# Patient Record
Sex: Female | Born: 1990 | Race: White | Hispanic: No | Marital: Single | Smoking: Never smoker
Health system: Southern US, Community
[De-identification: ages and names within clinical notes are randomized; demographics above are authoritative.]

## PROBLEM LIST (undated history)

## (undated) DIAGNOSIS — F419 Anxiety disorder, unspecified: Secondary | ICD-10-CM

## (undated) DIAGNOSIS — K589 Irritable bowel syndrome without diarrhea: Secondary | ICD-10-CM

## (undated) DIAGNOSIS — F32A Depression, unspecified: Secondary | ICD-10-CM

## (undated) DIAGNOSIS — K56609 Unspecified intestinal obstruction, unspecified as to partial versus complete obstruction: Secondary | ICD-10-CM

## (undated) DIAGNOSIS — N200 Calculus of kidney: Secondary | ICD-10-CM

## (undated) HISTORY — DX: Unspecified intestinal obstruction, unspecified as to partial versus complete obstruction: K56.609

## (undated) HISTORY — DX: Irritable bowel syndrome, unspecified: K58.9

## (undated) HISTORY — DX: Depression, unspecified: F32.A

## (undated) HISTORY — DX: Calculus of kidney: N20.0

---
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
## 2019-01-24 ENCOUNTER — Other Ambulatory Visit: Payer: Self-pay

## 2019-01-24 ENCOUNTER — Encounter (HOSPITAL_COMMUNITY): Payer: Self-pay | Admitting: Emergency Medicine

## 2019-01-24 ENCOUNTER — Emergency Department (HOSPITAL_COMMUNITY)
Admission: EM | Admit: 2019-01-24 | Discharge: 2019-01-24 | Disposition: A | Discharge status: Home / Self Care | Payer: Medicaid Other | Attending: Emergency Medicine | Admitting: Emergency Medicine

## 2019-01-24 DIAGNOSIS — F419 Anxiety disorder, unspecified: Secondary | ICD-10-CM | POA: Diagnosis present

## 2019-01-24 DIAGNOSIS — R112 Nausea with vomiting, unspecified: Secondary | ICD-10-CM | POA: Insufficient documentation

## 2019-01-24 HISTORY — DX: Anxiety disorder, unspecified: F41.9

## 2019-01-24 MED ORDER — ONDANSETRON 4 MG PO TBDP
4.0000 mg | ORAL_TABLET | Freq: Once | ORAL | Status: AC
  Administered 2019-01-24: 07:00:00 4 mg via ORAL
  Filled 2019-01-24: qty 1

## 2019-01-24 MED ORDER — DICYCLOMINE HCL 20 MG PO TABS: 20.0000 mg | ORAL_TABLET | Freq: Three times a day (TID) | ORAL | 0 refills | Status: DC | PRN

## 2019-01-24 MED ORDER — ONDANSETRON 4 MG PO TBDP: 4.0000 mg | ORAL_TABLET | Freq: Four times a day (QID) | ORAL | 0 refills | Status: DC | PRN

## 2019-01-24 MED ORDER — DICYCLOMINE HCL 10 MG/ML IM SOLN
20.0000 mg | Freq: Once | INTRAMUSCULAR | Status: AC
  Administered 2019-01-24: 08:00:00 20 mg via INTRAMUSCULAR
  Filled 2019-01-24: qty 2

## 2019-01-24 NOTE — ED Provider Notes (Signed)
TIME SEEN: 6:43 AM  CHIEF COMPLAINT: Nausea, vomiting, anxiety  HPI: Patient is a 28 year old female with history of anxiety who presents to the emergency department with nausea, vomiting that started today from her anxiety.  States she has a history of the same and has required Zofran in the past to stop vomiting.  Has no antiemetics at home.  States that her vomiting starts because she is very anxious and she is unable to take anxiety medicine because of her vomiting.  She denies fevers, chills, chest pain, shortness of breath, cough, diarrhea, abdominal pain.  Does have some burning in her throat from vomiting.  States she is sexually active with female partners.  She is not concerned about pregnancy.  Last menstrual cycle was April 9.  Denies SI, HI.  ROS: See HPI Constitutional: no fever  Eyes: no drainage  ENT: no runny nose   Cardiovascular:  no chest pain  Resp: no SOB  GI:  vomiting GU: no dysuria Integumentary: no rash  Allergy: no hives  Musculoskeletal: no leg swelling  Neurological: no slurred speech ROS otherwise negative  PAST MEDICAL HISTORY/PAST SURGICAL HISTORY:  Past Medical History:  Diagnosis Date  . Anxiety 2010    MEDICATIONS:  Prior to Admission medications   Not on File    ALLERGIES:  No Known Allergies  SOCIAL HISTORY:  Social History   Tobacco Use  . Smoking status: Never Smoker  . Smokeless tobacco: Never Used  Substance Use Topics  . Alcohol use: Not Currently    Comment: occasionally    FAMILY HISTORY: History reviewed. No pertinent family history.  EXAM: BP 119/84 (BP Location: Right Arm)   Pulse 86   Temp 98 F (36.7 C) (Oral)   Resp 18   Ht 5\' 3"  (1.6 m)   Wt 72.6 kg   LMP 01/19/2019   SpO2 100%   BMI 28.34 kg/m  CONSTITUTIONAL: Alert and oriented and responds appropriately to questions. Well-appearing; well-nourished HEAD: Normocephalic EYES: Conjunctivae clear, pupils appear equal, EOMI ENT: normal nose; moist mucous  membranes NECK: Supple, no meningismus, no nuchal rigidity, no LAD  CARD: RRR; S1 and S2 appreciated; no murmurs, no clicks, no rubs, no gallops RESP: Normal chest excursion without splinting or tachypnea; breath sounds clear and equal bilaterally; no wheezes, no rhonchi, no rales, no hypoxia or respiratory distress, speaking full sentences ABD/GI: Normal bowel sounds; non-distended; soft, non-tender, no rebound, no guarding, no peritoneal signs, no hepatosplenomegaly BACK:  The back appears normal and is non-tender to palpation, there is no CVA tenderness EXT: Normal ROM in all joints; non-tender to palpation; no edema; normal capillary refill; no cyanosis, no calf tenderness or swelling    SKIN: Normal color for age and race; warm; no rash NEURO: Moves all extremities equally PSYCH: The patient's mood and manner are appropriate. Grooming and personal hygiene are appropriate.  Denies SI or HI.  MEDICAL DECISION MAKING: Patient here with nausea, and vomiting that she states is related to her anxiety.  States she has a frequent history of the same and has previously been seen at [facility] in [facility].  States she just moved here.  States that she just wants something for nausea so that she can "get home and get back to my partner".  She has no psychiatric safety concerns at this time.  Her abdominal exam is benign.  Doubt appendicitis, colitis, bowel obstruction or diverticulitis, cholecystitis, pancreatitis.  Will give oral Zofran and p.o. challenge patient.  She  does not appear dehydrated on exam.  ED PROGRESS: Patient now reports diffuse abdominal cramping.  Will give IM Bentyl.  Reports nausea has improved and is no longer vomiting.  Drinking water without difficulty.  I feel patient is safe to be discharged home.  Will give outpatient follow-up.  Will discharge with prescriptions of Zofran and Bentyl to take at home.   At this time, I do not feel there is any  life-threatening condition present. I have reviewed and discussed all results (EKG, imaging, lab, urine as appropriate) and exam findings with patient/family. I have reviewed nursing notes and appropriate previous records.  I feel the patient is safe to be discharged home without further emergent workup and can continue workup as an outpatient as needed. Discussed usual and customary return precautions. Patient/family verbalize understanding and are comfortable with this plan.  Outpatient follow-up has been provided as needed. All questions have been answered.      Ward, Layla MawKristen N, DO 01/24/19 [phone]

## 2019-01-24 NOTE — ED Notes (Signed)
Give pt a cup of water. She is drinking water right now.

## 2019-01-24 NOTE — Discharge Instructions (Signed)
Steps to find a Primary Care Provider (PCP): ° °Call [phone] or [phone] to access "[facility] Find a Doctor Service." ° °2.  You may also go on the [facility] website at [email or web address] ° °3.  [facility] also frequently accepts new patients. ° °[facility]  °[address] °[place] [address] °[phone] ° °4.  There are also multiple [facility], [facility] and [facility] practices throughout the [place] that are frequently accepting new patients. You may find a clinic that is close to your home and contact them. ° °[facility] °[email or web address] °[phone] ° °[facility] °[email or web address] ° °[facility] °[email or web address] °[phone] ° °[facility] °[email or web address] °[phone] ° °5.  Local Health Departments also can provide primary care services. ° °[facility]  °[address] °[place] [address] [phone] ° °[facility] °[address] °[place] [address] [phone] ° °[facility] °[address]  °[place] [address] °[phone] ° ° °

## 2019-01-24 NOTE — ED Triage Notes (Signed)
Pt presents to ED c/o of anxiety attack that's causing n/v. Taking her anxiety meds regularly. Says it happens often. States, "I just need what they give me IV to help with throwing up so I can take my medicine and I'll be fine." Pt says this happens often. Emesis more than x8.

## 2019-02-25 ENCOUNTER — Emergency Department (HOSPITAL_COMMUNITY)
Admission: EM | Admit: 2019-02-25 | Discharge: 2019-02-25 | Disposition: A | Discharge status: Home / Self Care | Payer: Medicaid Other | Attending: Emergency Medicine | Admitting: Emergency Medicine

## 2019-02-25 ENCOUNTER — Encounter (HOSPITAL_COMMUNITY): Payer: Self-pay | Admitting: Emergency Medicine

## 2019-02-25 ENCOUNTER — Emergency Department (HOSPITAL_COMMUNITY): Payer: Medicaid Other

## 2019-02-25 DIAGNOSIS — Z79899 Other long term (current) drug therapy: Secondary | ICD-10-CM | POA: Diagnosis not present

## 2019-02-25 DIAGNOSIS — N23 Unspecified renal colic: Secondary | ICD-10-CM | POA: Diagnosis not present

## 2019-02-25 DIAGNOSIS — F1290 Cannabis use, unspecified, uncomplicated: Secondary | ICD-10-CM | POA: Diagnosis not present

## 2019-02-25 DIAGNOSIS — R112 Nausea with vomiting, unspecified: Secondary | ICD-10-CM

## 2019-02-25 DIAGNOSIS — R1031 Right lower quadrant pain: Secondary | ICD-10-CM

## 2019-02-25 LAB — COMPREHENSIVE METABOLIC PANEL
ALT: 16 U/L (ref 0–44)
AST: 19 U/L (ref 15–41)
Albumin: 4.3 g/dL (ref 3.5–5.0)
Alkaline Phosphatase: 47 U/L (ref 38–126)
Anion gap: 10 (ref 5–15)
BUN: 9 mg/dL (ref 6–20)
CO2: 24 mmol/L (ref 22–32)
Calcium: 8.9 mg/dL (ref 8.9–10.3)
Chloride: 104 mmol/L (ref 98–111)
Creatinine, Ser: 0.55 mg/dL (ref 0.44–1.00)
GFR calc Af Amer: >60 mL/min (ref >60)
GFR calc non Af Amer: >60 mL/min (ref >60)
Glucose, Bld: 150 mg/dL — ABNORMAL HIGH (ref 70–99)
Potassium: 3.2 mmol/L — ABNORMAL LOW (ref 3.5–5.1)
Sodium: 138 mmol/L (ref 135–145)
Total Bilirubin: 0.4 mg/dL (ref 0.3–1.2)
Total Protein: 7.4 g/dL (ref 6.5–8.1)

## 2019-02-25 LAB — CBC WITH DIFFERENTIAL/PLATELET
Abs Immature Granulocytes: 0.04 10*3/uL (ref 0.00–0.07)
Basophils Absolute: 0 10*3/uL (ref 0.0–0.1)
Basophils Relative: 0 %
Eosinophils Absolute: 0 10*3/uL (ref 0.0–0.5)
Eosinophils Relative: 0 %
HCT: 39.2 % (ref 36.0–46.0)
Hemoglobin: 12.2 g/dL (ref 12.0–15.0)
Immature Granulocytes: 0 %
Lymphocytes Relative: 9 %
Lymphs Abs: 1 10*3/uL (ref 0.7–4.0)
MCH: 26.6 pg (ref 26.0–34.0)
MCHC: 31.1 g/dL (ref 30.0–36.0)
MCV: 85.6 fL (ref 80.0–100.0)
Monocytes Absolute: 0.4 10*3/uL (ref 0.1–1.0)
Monocytes Relative: 3 %
Neutro Abs: 10 10*3/uL — ABNORMAL HIGH (ref 1.7–7.7)
Neutrophils Relative %: 88 %
Platelets: 187 10*3/uL (ref 150–400)
RBC: 4.58 MIL/uL (ref 3.87–5.11)
RDW: 12.9 % (ref 11.5–15.5)
WBC: 11.5 10*3/uL — ABNORMAL HIGH (ref 4.0–10.5)
nRBC: 0 % (ref 0.0–0.2)

## 2019-02-25 LAB — URINALYSIS, ROUTINE W REFLEX MICROSCOPIC
Bilirubin Urine: NEGATIVE
Glucose, UA: NEGATIVE mg/dL
Ketones, ur: 5 mg/dL — AB
Leukocytes,Ua: NEGATIVE
Nitrite: NEGATIVE
Protein, ur: NEGATIVE mg/dL
Specific Gravity, Urine: 1.003 — ABNORMAL LOW (ref 1.005–1.030)
pH: 7 (ref 5.0–8.0)

## 2019-02-25 LAB — RAPID URINE DRUG SCREEN, HOSP PERFORMED
Amphetamines: NOT DETECTED
Barbiturates: NOT DETECTED
Benzodiazepines: NOT DETECTED
Cocaine: NOT DETECTED
Opiates: POSITIVE — AB
Tetrahydrocannabinol: POSITIVE — AB

## 2019-02-25 LAB — I-STAT BETA HCG BLOOD, ED (MC, WL, AP ONLY): I-stat hCG, quantitative: <5 m[IU]/mL (ref <5)

## 2019-02-25 LAB — LIPASE, BLOOD: Lipase: 26 U/L (ref 11–51)

## 2019-02-25 MED ORDER — TAMSULOSIN HCL 0.4 MG PO CAPS: 0.4000 mg | ORAL_CAPSULE | Freq: Two times a day (BID) | ORAL | 0 refills | Status: DC

## 2019-02-25 MED ORDER — SODIUM CHLORIDE (PF) 0.9 % IJ SOLN
INTRAMUSCULAR | Status: AC
  Filled 2019-02-25: qty 50

## 2019-02-25 MED ORDER — METOCLOPRAMIDE HCL 5 MG/ML IJ SOLN
10.0000 mg | Freq: Once | INTRAMUSCULAR | Status: AC
  Administered 2019-02-25: 10 mg via INTRAVENOUS
  Filled 2019-02-25: qty 2

## 2019-02-25 MED ORDER — ONDANSETRON 8 MG PO TBDP: ORAL_TABLET | ORAL | 0 refills | Status: DC

## 2019-02-25 MED ORDER — POTASSIUM CHLORIDE CRYS ER 20 MEQ PO TBCR
40.0000 meq | EXTENDED_RELEASE_TABLET | Freq: Once | ORAL | Status: AC
  Administered 2019-02-25: 40 meq via ORAL
  Filled 2019-02-25: qty 2

## 2019-02-25 MED ORDER — MORPHINE SULFATE (PF) 4 MG/ML IV SOLN
4.0000 mg | Freq: Once | INTRAVENOUS | Status: AC
  Administered 2019-02-25: 4 mg via INTRAVENOUS
  Filled 2019-02-25: qty 1

## 2019-02-25 MED ORDER — SODIUM CHLORIDE 0.9 % IV BOLUS
1000.0000 mL | Freq: Once | INTRAVENOUS | Status: AC
  Administered 2019-02-25: 1000 mL via INTRAVENOUS

## 2019-02-25 MED ORDER — IOHEXOL 300 MG/ML  SOLN
100.0000 mL | Freq: Once | INTRAMUSCULAR | Status: AC | PRN
  Administered 2019-02-25: 100 mL via INTRAVENOUS

## 2019-02-25 MED ORDER — HYDROCODONE-ACETAMINOPHEN 5-325 MG PO TABS: 1.0000 | ORAL_TABLET | ORAL | 0 refills | Status: DC | PRN

## 2019-02-25 MED ORDER — CAPSAICIN 0.025 % EX CREA
TOPICAL_CREAM | Freq: Two times a day (BID) | CUTANEOUS | Status: DC
  Administered 2019-02-25: 1 via TOPICAL
  Filled 2019-02-25: qty 60

## 2019-02-25 NOTE — ED Notes (Signed)
Called lab urine culture added  

## 2019-02-25 NOTE — Discharge Instructions (Addendum)
1. Medications: vicodin for pain, flomax, zofran for vomiting, usual home medications 2. Treatment: rest, drink plenty of fluids,  3. Follow Up: Please followup with your primary doctor in 2-3 days for discussion of your diagnoses and further evaluation after today's visit; if you do not have a primary care doctor use the resource guide provided to find one; Please return to the ER for worsening pain, fever, persistent vomiting or other concerns

## 2019-02-25 NOTE — ED Notes (Signed)
Bed: TM22 Expected date:  Expected time:  Means of arrival:  Comments: 28 yo M/Abd pain

## 2019-02-25 NOTE — ED Triage Notes (Addendum)
Pt comes to ed via ems, c/o abdominal pain started a hr ago. Pt was complaining of constipation before that. Pt has 18 in RAC, 50 fen tyle given, and 4 of zofran. C/o right lower abdominal pain radiating to right flank. Comes from home.  V/s on arrival 128/73, hr 86, pain 10 out 10, spo2 98. Rr16.  Screaming and yelling and making vomiting noises.

## 2019-02-25 NOTE — ED Provider Notes (Signed)
[facility]-EMERGENCY DEPT Provider Note   CSN: [id number] Arrival date & time: 02/25/19  0132    History   Chief Complaint Chief Complaint  Patient presents with  . Abdominal Pain    r     HPI Cindy Mahoney is a 28 y.o. female with a hx of anxiety presents to the Emergency Department complaining of gradual, persistent, progressively worsening suprapubic abd pain onset 10pm. Associated symptoms include nausea and vomiting.  Pt reports emesis is NBNB.  Pt denies diarrhea, fever, chills, headache, neck pain, chest pain, SOB, weakness, dizziness, syncope, dysuria, hematuria.  LMP: 02/13/2019.  She reports she is unconcerned about pregnancy.  Pt denies vaginal discharge, vaginal bleeding.  She reports she is not concerned for STD.  Pt given Fentanyl enroute without relief.  Pt reports regular marijuana usage including today.  Pt states pain is a 10/10 and cramping.  Does note states right lower quadrant abdominal pain that radiates into her back.  Patient reports she has chronic back pain and pain is more suprapubic.  She denies all urinary symptoms.         The history is provided by the patient and medical records. No language interpreter was used.    Past Medical History:  Diagnosis Date  . Anxiety 2010    There are no active problems to display for this patient.   History reviewed. No pertinent surgical history.   OB History    Gravida  1   Para      Term      Preterm      AB      Living        SAB      TAB      Ectopic      Multiple      Live Births               Home Medications    Prior to Admission medications   Medication Sig Start Date End Date Taking? Authorizing Provider  acetaminophen (TYLENOL) 500 MG tablet Take 250 mg by mouth every 6 (six) hours as needed for moderate pain.   Yes [provider]  dicyclomine (BENTYL) 20 MG tablet Take 1 tablet (20 mg total) by mouth every 8 (eight) hours as needed for  spasms (Abdominal cramping). 01/24/19  Yes Ward, Layla MawKristen N, DO  sertraline (ZOLOFT) 25 MG tablet Take 25 mg by mouth daily.   Yes [provider]  HYDROcodone-acetaminophen (NORCO/VICODIN) 5-325 MG tablet Take 1 tablet by mouth every 4 (four) hours as needed. 02/25/19   Temesgen Weightman, Dahlia ClientHannah, PA-C  ondansetron (ZOFRAN ODT) 8 MG disintegrating tablet 8mg  ODT q4 hours prn nausea 02/25/19   Mckenzi Buonomo, Dahlia ClientHannah, PA-C  tamsulosin (FLOMAX) 0.4 MG CAPS capsule Take 1 capsule (0.4 mg total) by mouth 2 (two) times daily. 02/25/19   Aldous Housel, Dahlia ClientHannah, PA-C    Family History No family history on file.  Social History Social History   Tobacco Use  . Smoking status: Never Smoker  . Smokeless tobacco: Never Used  Substance Use Topics  . Alcohol use: Not Currently    Comment: occasionally  . Drug use: Yes    Frequency: 3.0 times per week    Types: Marijuana     Allergies   Patient has no known allergies.   Review of Systems Review of Systems  Constitutional: Negative for appetite change, diaphoresis, fatigue, fever and unexpected weight change.  HENT: Negative for mouth sores.   Eyes: Negative for  visual disturbance.  Respiratory: Negative for cough, chest tightness, shortness of breath and wheezing.   Cardiovascular: Negative for chest pain.  Gastrointestinal: Positive for abdominal pain, nausea and vomiting. Negative for constipation and diarrhea.  Endocrine: Negative for polydipsia, polyphagia and polyuria.  Genitourinary: Negative for dysuria, frequency, hematuria and urgency.  Musculoskeletal: Negative for back pain and neck stiffness.  Skin: Negative for rash.  Allergic/Immunologic: Negative for immunocompromised state.  Neurological: Negative for syncope, light-headedness and headaches.  Hematological: Does not bruise/bleed easily.  Psychiatric/Behavioral: Negative for sleep disturbance. The patient is not nervous/anxious.      Physical Exam Updated Vital Signs BP  (!) 124/104 (BP Location: Left Arm)   Pulse 65   Temp (!) 97.3 F (36.3 C) (Oral)   Resp 16   LMP 01/19/2019   SpO2 100%   Physical Exam Vitals signs and nursing note reviewed.  Constitutional:      General: She is in acute distress.     Appearance: She is well-developed. She is not diaphoretic.     Comments: Awake, alert. Pt screaming and writhing in bed.  Intermittent vomiting.  HENT:     Head: Normocephalic and atraumatic.     Mouth/Throat:     Pharynx: No oropharyngeal exudate.  Eyes:     General: No scleral icterus.    Conjunctiva/sclera: Conjunctivae normal.  Neck:     Musculoskeletal: Normal range of motion and neck supple.  Cardiovascular:     Rate and Rhythm: Normal rate and regular rhythm.  Pulmonary:     Effort: Pulmonary effort is normal. No respiratory distress.     Breath sounds: Normal breath sounds. No wheezing.  Abdominal:     General: Bowel sounds are normal.     Palpations: Abdomen is soft. There is no mass.     Tenderness: There is generalized abdominal tenderness and tenderness in the suprapubic area. There is guarding. There is no right CVA tenderness, left CVA tenderness or rebound.     Hernia: No hernia is present.  Musculoskeletal: Normal range of motion.  Skin:    General: Skin is warm and dry.  Neurological:     Mental Status: She is alert.     Comments: Speech is clear and goal oriented Moves extremities without ataxia      ED Treatments / Results  Labs (all labs ordered are listed, but only abnormal results are displayed) Labs Reviewed  CBC WITH DIFFERENTIAL/PLATELET - Abnormal; Notable for the following components:      Result Value   WBC 11.5 (*)    Neutro Abs 10.0 (*)    All other components within normal limits  COMPREHENSIVE METABOLIC PANEL - Abnormal; Notable for the following components:   Potassium 3.2 (*)    Glucose, Bld 150 (*)    All other components within normal limits  URINALYSIS, ROUTINE W REFLEX MICROSCOPIC -  Abnormal; Notable for the following components:   Color, Urine STRAW (*)    Specific Gravity, Urine 1.003 (*)    Hgb urine dipstick LARGE (*)    Ketones, ur 5 (*)    Bacteria, UA MANY (*)    All other components within normal limits  RAPID URINE DRUG SCREEN, HOSP PERFORMED - Abnormal; Notable for the following components:   Opiates POSITIVE (*)    Tetrahydrocannabinol POSITIVE (*)    All other components within normal limits  URINE CULTURE  LIPASE, BLOOD  I-STAT BETA HCG BLOOD, ED ([facility], [facility], [facility] ONLY)    Radiology Ct Abdomen Pelvis W  Contrast  Result Date: 02/25/2019 CLINICAL DATA:  Nausea, vomiting and abdominal pain EXAM: CT ABDOMEN AND PELVIS WITH CONTRAST TECHNIQUE: Multidetector CT imaging of the abdomen and pelvis was performed using the standard protocol following bolus administration of intravenous contrast. CONTRAST:  OMNIPAQUE IOHEXOL 300 MG/ML  SOLN COMPARISON:  None. FINDINGS: LOWER CHEST: There is no basilar pleural or apical pericardial effusion. HEPATOBILIARY: The hepatic contours and density are normal. There is no intra- or extrahepatic biliary dilatation. The gallbladder is normal. PANCREAS: The pancreatic parenchymal contours are normal and there is no ductal dilatation. There is no peripancreatic fluid collection. SPLEEN: Normal. ADRENALS/URINARY TRACT: --Adrenal glands: Normal. --Right kidney/ureter: There is a stone within the ureterovesical junction measuring 3 mm, causing mild hydroureteronephrosis and no perinephric stranding. --Left kidney/ureter: No hydronephrosis, nephroureterolithiasis, perinephric stranding or solid renal mass. --Urinary bladder: Normal for degree of distention STOMACH/BOWEL: --Stomach/Duodenum: There is no hiatal hernia or other gastric abnormality. The duodenal course and caliber are normal. --Small bowel: No dilatation or inflammation. --Colon: No focal abnormality. --Appendix: Normal. VASCULAR/LYMPHATIC: Normal course and caliber of the major  abdominal vessels. No abdominal or pelvic lymphadenopathy. REPRODUCTIVE: Normal uterus and ovaries. MUSCULOSKELETAL. No bony spinal canal stenosis or focal osseous abnormality. OTHER: None. IMPRESSION: 1. Right obstructive uropathy with 3 mm stone at the ureterovesical junction causing mild hydroureteronephrosis. 2. No nephrolithiasis. 3. Normal appendix. Electronically Signed   By: Deatra Mort M.D.   On: 02/25/2019 06:45    Procedures Procedures (including critical care time)  Medications Ordered in ED Medications  capsaicin (ZOSTRIX) 0.025 % cream (1 application Topical Given 02/25/19 0243)  sodium chloride (PF) 0.9 % injection (has no administration in time range)  metoCLOPramide (REGLAN) injection 10 mg (10 mg Intravenous Given 02/25/19 0256)  sodium chloride 0.9 % bolus 1,000 mL (0 mLs Intravenous Stopped 02/25/19 0451)  morphine 4 MG/ML injection 4 mg (4 mg Intravenous Given 02/25/19 0257)  potassium chloride SA (K-DUR) CR tablet 40 mEq (40 mEq Oral Given 02/25/19 0506)  iohexol (OMNIPAQUE) 300 MG/ML solution 100 mL (100 mLs Intravenous Contrast Given 02/25/19 0616)     Initial Impression / Assessment and Plan / ED Course  I have reviewed the triage vital signs and the nursing notes.  Pertinent labs & imaging results that were available during my care of the patient were reviewed by me and considered in my medical decision making (see chart for details).  Clinical Course as of Feb 24 657  Sat Feb 25, 2019  4098 Patient reports she is feeling better.  Pain is now a 3/10.  She does not wish for additional pain medication.  On repeat clinical exam her pain is more focal in the right lower quadrant however she is without rebound or guarding.  Due to leukocytosis and more focal pain at this time, will obtain CT scan.  Patient and I discussed concerned about possible PID.  She is not concerned about this and does not want a pelvic exam at this time.  She does understand that my medical decision  will be somewhat limited by not performing a pelvic exam.  He does not wish for one at this time.   [HM]  [id number] Noted.  Pt is not menstruating.  Hgb urine dipstick(!): LARGE [HM]  0643 Mild hypokalemia.  Potassium given in ED  Potassium(!): 3.2 [HM]  0643 noted  WBC(!): 11.5 [HM]  0643 Tetrahydrocannabinol(!): POSITIVE [HM]  0643 Opiates(!): POSITIVE [HM]  0643 Pt afebrile.  No tachycardia.  Temp: 98.4 F (36.9 C) [  HM]    Clinical Course User Index [HM] Trixy Loyola, Boyd Kerbs       Patient presents with lower abdominal pain.  On initial exam she is screaming, intermittently vomiting.  She is unwilling to lay flat.  She has chronic back pain, but no CVA tenderness on exam.  Labs reassuring with mild hypokalemia and leukocytosis.  Drug screen positive for opiates and marijuana.    05:50 AM On repeat exam after pain control, patient's pain is more focal in the right lower quadrant.  She is without rebound or guarding.  Urinalysis shows large amount of hemoglobin and bacteria but no white blood cells nitrites or leukocytes.  Concern for potential renal colic versus appendicitis.  Will obtain CT scan.  6:58 AM CT scan with 3mm UVJ stone and mild hydronephrosis.  Pt is well-appearing pain remains well under control.  No additional vomiting here in the emergency department.  She has tolerated p.o. without difficulty.  Discussed findings of the CT scan with the patient.  She will have close follow-up with urology.  On repeat exam, patient remains without rebound or guarding.  No CVA tenderness.  Discussed reasons to return immediately to the emergency department.  Questions answered.  Patient is in agreement with the plan for discharge.  Final Clinical Impressions(s) / ED Diagnoses   Final diagnoses:  Right lower quadrant abdominal pain  Non-intractable vomiting with nausea, unspecified vomiting type  Marijuana use  Renal colic on right side    ED Discharge Orders         Ordered     tamsulosin (FLOMAX) 0.4 MG CAPS capsule  2 times daily     02/25/19 0655    ondansetron (ZOFRAN ODT) 8 MG disintegrating tablet     02/25/19 0655    HYDROcodone-acetaminophen (NORCO/VICODIN) 5-325 MG tablet  Every 4 hours PRN     02/25/19 0655           Sufian Ravi, Dahlia Client, PA-C 02/25/19 0659    Ward, Layla Maw, DO 02/25/19 8119

## 2019-02-25 NOTE — ED Notes (Signed)
Walked pt to [employer] out pts eye

## 2019-02-26 ENCOUNTER — Encounter (HOSPITAL_COMMUNITY): Payer: Self-pay

## 2019-02-26 ENCOUNTER — Other Ambulatory Visit: Payer: Self-pay

## 2019-02-26 ENCOUNTER — Emergency Department (HOSPITAL_COMMUNITY)
Admission: EM | Admit: 2019-02-26 | Discharge: 2019-02-26 | Disposition: A | Discharge status: Home / Self Care | Payer: Medicaid Other | Attending: Emergency Medicine | Admitting: Emergency Medicine

## 2019-02-26 DIAGNOSIS — Z79899 Other long term (current) drug therapy: Secondary | ICD-10-CM | POA: Diagnosis not present

## 2019-02-26 DIAGNOSIS — R1031 Right lower quadrant pain: Secondary | ICD-10-CM | POA: Diagnosis present

## 2019-02-26 DIAGNOSIS — N201 Calculus of ureter: Secondary | ICD-10-CM | POA: Diagnosis not present

## 2019-02-26 LAB — CBC WITH DIFFERENTIAL/PLATELET
Abs Immature Granulocytes: 0.01 10*3/uL (ref 0.00–0.07)
Basophils Absolute: 0 10*3/uL (ref 0.0–0.1)
Basophils Relative: 0 %
Eosinophils Absolute: 0.1 10*3/uL (ref 0.0–0.5)
Eosinophils Relative: 2 %
HCT: 38.3 % (ref 36.0–46.0)
Hemoglobin: 11.9 g/dL — ABNORMAL LOW (ref 12.0–15.0)
Immature Granulocytes: 0 %
Lymphocytes Relative: 36 %
Lymphs Abs: 2.8 10*3/uL (ref 0.7–4.0)
MCH: 27.1 pg (ref 26.0–34.0)
MCHC: 31.1 g/dL (ref 30.0–36.0)
MCV: 87.2 fL (ref 80.0–100.0)
Monocytes Absolute: 0.5 10*3/uL (ref 0.1–1.0)
Monocytes Relative: 7 %
Neutro Abs: 4.3 10*3/uL (ref 1.7–7.7)
Neutrophils Relative %: 55 %
Platelets: 179 10*3/uL (ref 150–400)
RBC: 4.39 MIL/uL (ref 3.87–5.11)
RDW: 13.2 % (ref 11.5–15.5)
WBC: 7.8 10*3/uL (ref 4.0–10.5)
nRBC: 0 % (ref 0.0–0.2)

## 2019-02-26 LAB — BASIC METABOLIC PANEL
Anion gap: 5 (ref 5–15)
BUN: 8 mg/dL (ref 6–20)
CO2: 30 mmol/L (ref 22–32)
Calcium: 8.7 mg/dL — ABNORMAL LOW (ref 8.9–10.3)
Chloride: 103 mmol/L (ref 98–111)
Creatinine, Ser: 0.57 mg/dL (ref 0.44–1.00)
GFR calc Af Amer: >60 mL/min (ref >60)
GFR calc non Af Amer: >60 mL/min (ref >60)
Glucose, Bld: 113 mg/dL — ABNORMAL HIGH (ref 70–99)
Potassium: 3.6 mmol/L (ref 3.5–5.1)
Sodium: 138 mmol/L (ref 135–145)

## 2019-02-26 LAB — URINE CULTURE: Culture: 20000 — AB

## 2019-02-26 MED ORDER — HYDROCODONE-ACETAMINOPHEN 5-325 MG PO TABS: 1.0000 | ORAL_TABLET | Freq: Once | ORAL | Status: DC

## 2019-02-26 MED ORDER — HYDROCODONE-ACETAMINOPHEN 5-325 MG PO TABS: 1.0000 | ORAL_TABLET | Freq: Four times a day (QID) | ORAL | 0 refills | Status: DC | PRN

## 2019-02-26 MED ORDER — KETOROLAC TROMETHAMINE 15 MG/ML IJ SOLN
15.0000 mg | Freq: Once | INTRAMUSCULAR | Status: AC
  Administered 2019-02-26: 15 mg via INTRAVENOUS
  Filled 2019-02-26: qty 1

## 2019-02-26 NOTE — ED Triage Notes (Addendum)
Patient seen here Saturday night.   Patient reports she had a kidney stone and was told to come back if it got worse.   Patient states she is out of pain medication and it still having pain.   Patient states she has not passed the stone yet.   C/O right lower quadrant pain 6/10 pain.   A/Ox4  Patient states she is not pregnant.

## 2019-02-26 NOTE — ED Provider Notes (Signed)
[facility]-EMERGENCY DEPT Provider Note   CSN: [id number] Arrival date & time: 02/26/19  8416    History   Chief Complaint No chief complaint on file.   HPI Cindy Mahoney is a 28 y.o. female who presents today for evaluation of kidney stone pain.  She was seen and discharged this morning at 7 AM for a right-sided kidney stone.  She reports that she was told to come back if her pain got worse.  She has already taken all of her pain medicine and says that her pain is getting worse.  Chart review and review of PMP show that she was given 6 pain pills with instructions to take it every 4 hours as needed.  She tells me that initially she took 2 of them has taken the rest throughout the day and then prior to coming took an additional 2 and is now out.  She reports that her pain is not significantly worse than it was prior to her visit today.  She reports that her pain is a 8 out of 10 as opposed to when she came and it was 11 out of 10.  Chart review shows that she has a 3 mm stone on the right side.  She denies any new concerns.  Her pain has not changed locations.  No fevers.  He is refusing UA, stating that she will go to her urologist tomorrow.  Of note patient also reports that she has dissociative identity disorder and that during her visit last night she reportedly kept frequently dissociating.     HPI  Past Medical History:  Diagnosis Date   Anxiety 2010    There are no active problems to display for this patient.   History reviewed. No pertinent surgical history.   OB History    Gravida  1   Para      Term      Preterm      AB      Living        SAB      TAB      Ectopic      Multiple      Live Births               Home Medications    Prior to Admission medications   Medication Sig Start Date End Date Taking? Authorizing Provider  acetaminophen (TYLENOL) 500 MG tablet Take 250 mg by mouth every 6 (six) hours as  needed for moderate pain.   Yes [provider]  dicyclomine (BENTYL) 20 MG tablet Take 1 tablet (20 mg total) by mouth every 8 (eight) hours as needed for spasms (Abdominal cramping). 01/24/19  Yes Ward, Layla Maw, DO  ondansetron (ZOFRAN ODT) 8 MG disintegrating tablet 8mg  ODT q4 hours prn nausea 02/25/19  Yes Muthersbaugh, Dahlia Client, PA-C  sertraline (ZOLOFT) 25 MG tablet Take 25 mg by mouth daily.   Yes [provider]  tamsulosin (FLOMAX) 0.4 MG CAPS capsule Take 1 capsule (0.4 mg total) by mouth 2 (two) times daily. 02/25/19  Yes Muthersbaugh, Dahlia Client, PA-C  HYDROcodone-acetaminophen (NORCO/VICODIN) 5-325 MG tablet Take 1 tablet by mouth every 6 (six) hours as needed for severe pain. 02/26/19   Cristina Gong, PA-C    Family History History reviewed. No pertinent family history.  Social History Social History   Tobacco Use   Smoking status: Never Smoker   Smokeless tobacco: Never Used  Substance Use Topics   Alcohol use: Not Currently  Comment: occasionally   Drug use: Yes    Frequency: 3.0 times per week    Types: Marijuana     Allergies   Patient has no known allergies.   Review of Systems Review of Systems  Constitutional: Negative for activity change and fever.  HENT: Negative for congestion.   Respiratory: Negative for shortness of breath.   Genitourinary: Negative for dysuria, flank pain and frequency.       Right lateral abdominal pain, lower  All other systems reviewed and are negative.    Physical Exam Updated Vital Signs BP 109/75    Pulse 70    Temp 98 F (36.7 C) (Oral)    Resp 18    LMP 01/19/2019    SpO2 98%   Physical Exam Vitals signs and nursing note reviewed.  Constitutional:      General: She is not in acute distress.    Appearance: She is well-developed.  HENT:     Head: Normocephalic and atraumatic.     Mouth/Throat:     Mouth: Mucous membranes are moist.  Eyes:     Conjunctiva/sclera: Conjunctivae normal.    Neck:     Musculoskeletal: Neck supple.  Cardiovascular:     Rate and Rhythm: Normal rate and regular rhythm.     Heart sounds: No murmur.  Pulmonary:     Effort: Pulmonary effort is normal. No respiratory distress.     Breath sounds: Normal breath sounds.  Abdominal:     General: Bowel sounds are normal.     Palpations: Abdomen is soft.     Tenderness: There is no abdominal tenderness.     Hernia: No hernia is present.  Skin:    General: Skin is warm and dry.  Neurological:     General: No focal deficit present.     Mental Status: She is alert and oriented to person, place, and time.  Psychiatric:        Mood and Affect: Mood normal.        Behavior: Behavior normal.      ED Treatments / Results  Labs (all labs ordered are listed, but only abnormal results are displayed) Labs Reviewed  BASIC METABOLIC PANEL - Abnormal; Notable for the following components:      Result Value   Glucose, Bld 113 (*)    Calcium 8.7 (*)    All other components within normal limits  CBC WITH DIFFERENTIAL/PLATELET - Abnormal; Notable for the following components:   Hemoglobin 11.9 (*)    All other components within normal limits    EKG None  Radiology- obtained at previous visit.  Ct Abdomen Pelvis W Contrast  Result Date: 02/25/2019 CLINICAL DATA:  Nausea, vomiting and abdominal pain EXAM: CT ABDOMEN AND PELVIS WITH CONTRAST TECHNIQUE: Multidetector CT imaging of the abdomen and pelvis was performed using the standard protocol following bolus administration of intravenous contrast. CONTRAST:  OMNIPAQUE IOHEXOL 300 MG/ML  SOLN COMPARISON:  None. FINDINGS: LOWER CHEST: There is no basilar pleural or apical pericardial effusion. HEPATOBILIARY: The hepatic contours and density are normal. There is no intra- or extrahepatic biliary dilatation. The gallbladder is normal. PANCREAS: The pancreatic parenchymal contours are normal and there is no ductal dilatation. There is no peripancreatic  fluid collection. SPLEEN: Normal. ADRENALS/URINARY TRACT: --Adrenal glands: Normal. --Right kidney/ureter: There is a stone within the ureterovesical junction measuring 3 mm, causing mild hydroureteronephrosis and no perinephric stranding. --Left kidney/ureter: No hydronephrosis, nephroureterolithiasis, perinephric stranding or solid renal mass. --Urinary bladder: Normal for  degree of distention STOMACH/BOWEL: --Stomach/Duodenum: There is no hiatal hernia or other gastric abnormality. The duodenal course and caliber are normal. --Small bowel: No dilatation or inflammation. --Colon: No focal abnormality. --Appendix: Normal. VASCULAR/LYMPHATIC: Normal course and caliber of the major abdominal vessels. No abdominal or pelvic lymphadenopathy. REPRODUCTIVE: Normal uterus and ovaries. MUSCULOSKELETAL. No bony spinal canal stenosis or focal osseous abnormality. OTHER: None. IMPRESSION: 1. Right obstructive uropathy with 3 mm stone at the ureterovesical junction causing mild hydroureteronephrosis. 2. No nephrolithiasis. 3. Normal appendix. Electronically Signed   By: Deatra RobinsonKevin  Herman M.D.   On: 02/25/2019 06:45    Procedures Procedures (including critical care time)  Medications Ordered in ED Medications  ketorolac (TORADOL) 15 MG/ML injection 15 mg (15 mg Intravenous Given 02/26/19 2033)     Initial Impression / Assessment and Plan / ED Course  I have reviewed the triage vital signs and the nursing notes.  Pertinent labs & imaging results that were available during my care of the patient were reviewed by me and considered in my medical decision making (see chart for details).  Clinical Course as of Feb 25 2058  Sun Feb 26, 2019  [address]   [EH]    Clinical Course User Index [EH] Cristina GongHammond, Javaya [place] W, PA-C      Patient presents today for concern of pain with kidney stones.  She was seen and discharged approximately 12 hours prior to arrival for a right sided ureteral 3 mm stone.  Review of  chart and PMP shows that she was given 6 pills of Vicodin with instructions to take 1 every 4 hours as needed.  She states that she has taken this amount in the past 12 hours as it was not controlling her pain.  She reports that her pain is not as bad as when she came in originally rather she is afraid that it will return.  She denies any other new concerns.  Labs were obtained without evidence of acute abnormalities.  She is given a dose of IV Toradol while in the department.  We discussed, at length, appropriate use of narcotic medicine and the need to take additional medicines along with appropriate pain goals including pain management rather than elimination.    PMP does not show that she has frequent narcotic prescriptions and CT scan from previous visit does show that she has a kidney stone which can be a acutely extremely painful condition.  We will give her a prescription for 4 hydrocodone with instructions to take them every 6 hours as needed.  This prescription is sent to a pharmacy, per her request, that will not be open until tomorrow morning therefore she should not have access to the additional narcotics until her original prescription would be used up.   Discussed augmentative therapy.   Patient refused repeat UA, stating that she will go to her urologist tomorrow.  Return precautions were discussed with patient who states their understanding.  At the time of discharge patient denied any unaddressed complaints or concerns.  Patient is agreeable for discharge home.   Final Clinical Impressions(s) / ED Diagnoses   Final diagnoses:  Ureteral stone    ED Discharge Orders         Ordered    HYDROcodone-acetaminophen (NORCO/VICODIN) 5-325 MG tablet  Every 6 hours PRN     02/26/19 2057           Norman ClayHammond, Zaidyn Claire W, PA-C 02/26/19 2104    Gerhard MunchLockwood, Robert, MD 03/02/19 [phone]

## 2019-02-26 NOTE — Discharge Instructions (Signed)
Today you received medications that may make you sleepy or impair your ability to make decisions.  For the next 24 hours please do not drive, operate heavy machinery, care for a small child with out another adult present, or perform any activities that may cause harm to you or someone else if you were to fall asleep or be impaired.   You are being prescribed a medication which may make you sleepy. Please follow up of listed precautions for at least 24 hours after taking one dose.  You were given a dose of Toradol while in the emergency room.  Please do not take any ibuprofen for 8 hours.  Your prescription pain medicine has Tylenol in it and so you should not take extra Tylenol.  It is very important that you take your medicine as prescribed and do not double doses.

## 2019-02-26 NOTE — ED Notes (Signed)
ED Provider at bedside. 

## 2019-12-01 IMAGING — CT CT ABDOMEN AND PELVIS WITH CONTRAST
2 of 4 series · 17 of 46 positions shown, 19 images · IV contrast (ISOVUE)
Comparison: None.

CLINICAL DATA: Nausea, vomiting and abdominal pain

EXAM:
CT ABDOMEN AND PELVIS WITH CONTRAST
TECHNIQUE: Multidetector CT imaging of the abdomen and pelvis was performed
using the standard protocol following bolus administration of
intravenous contrast.
CONTRAST:  100mL OMNIPAQUE IOHEXOL 300 MG/ML  SOLN

[Series 2: axial st · axial · 0.91mm/px · z∈[+1107,+1502]mm · 14 of 89 slices shown, 16 images]
[im 5/89  soft-tissue]
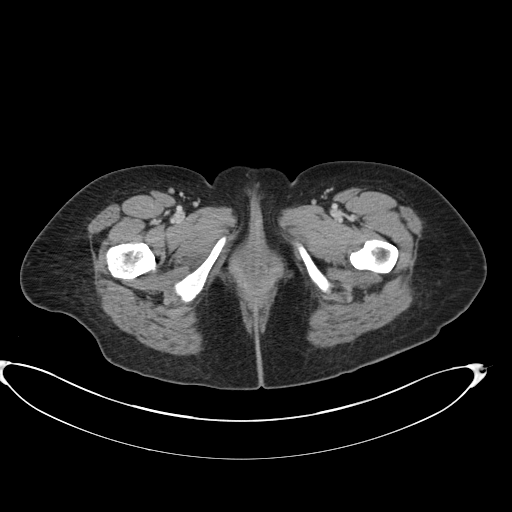
[im 5/89  bone]
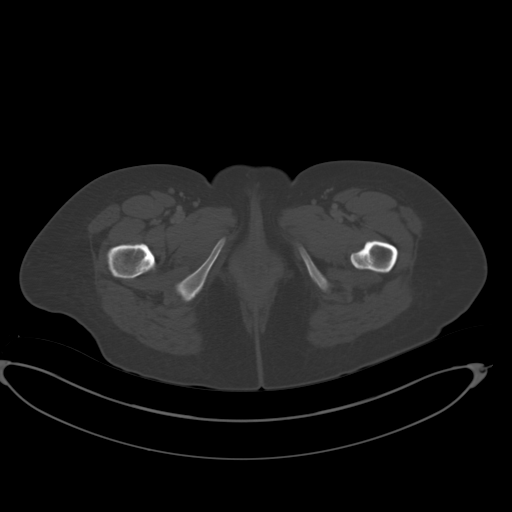
[im 10/89  soft-tissue]
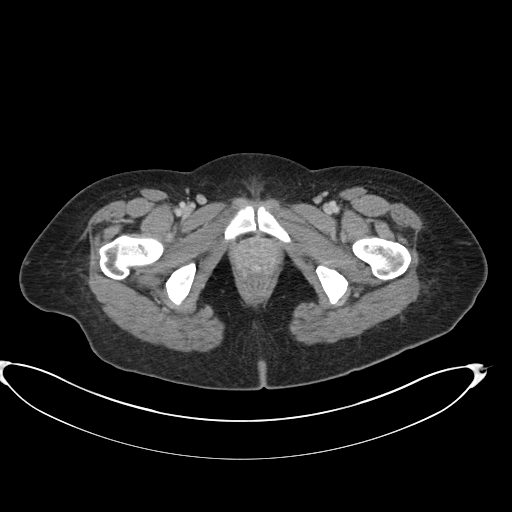
[im 20/89  soft-tissue]
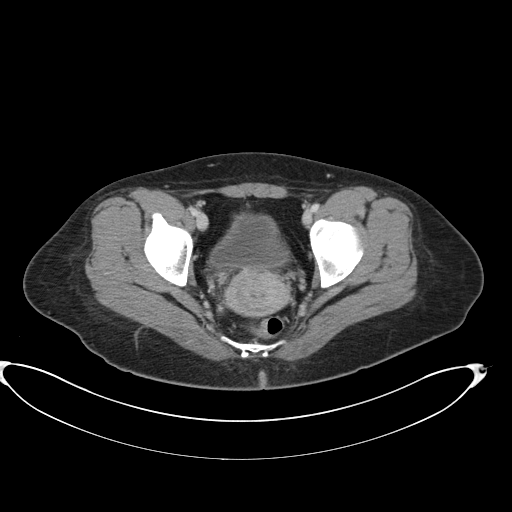
[im 25/89  soft-tissue]
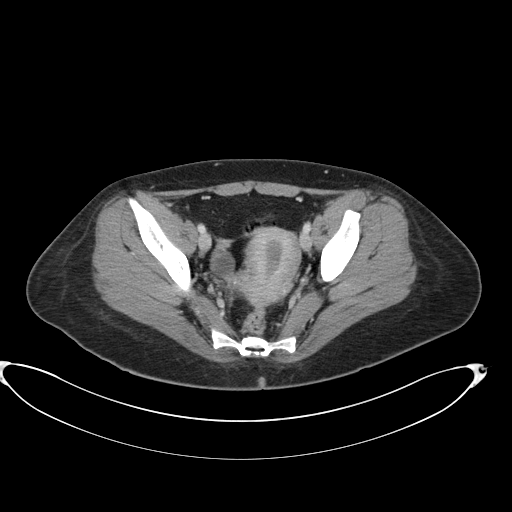
[im 30/89  soft-tissue]
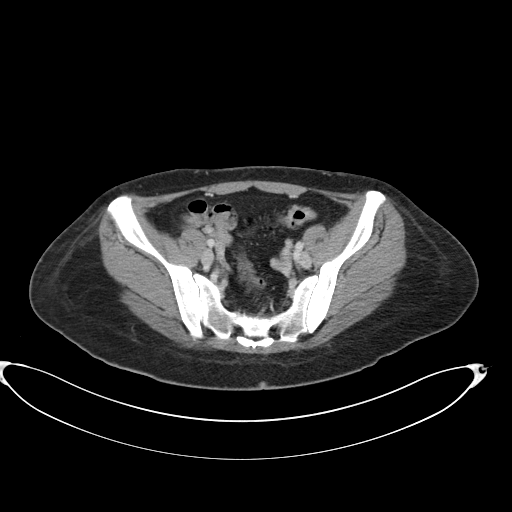
[im 35/89  soft-tissue]
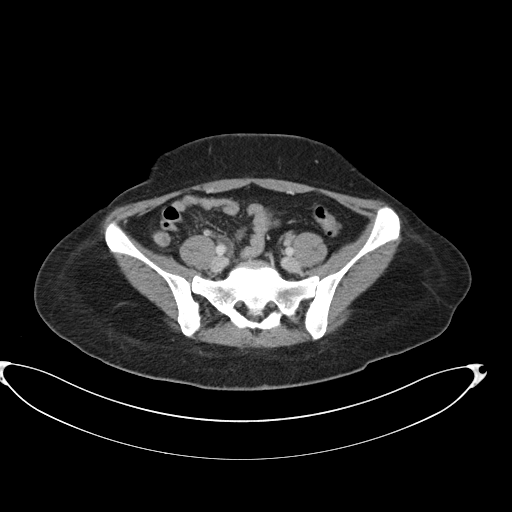
[im 40/89  soft-tissue]
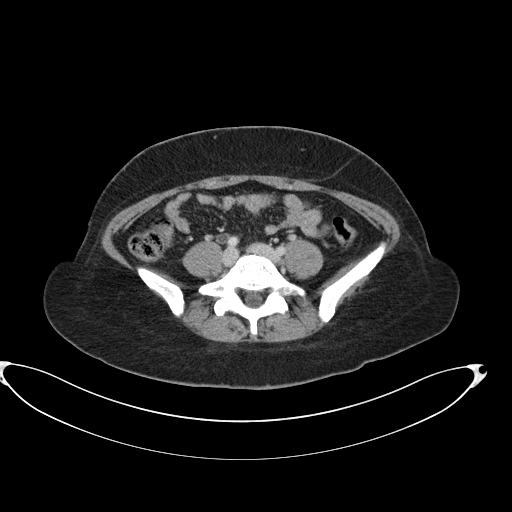
[im 49/89  soft-tissue]
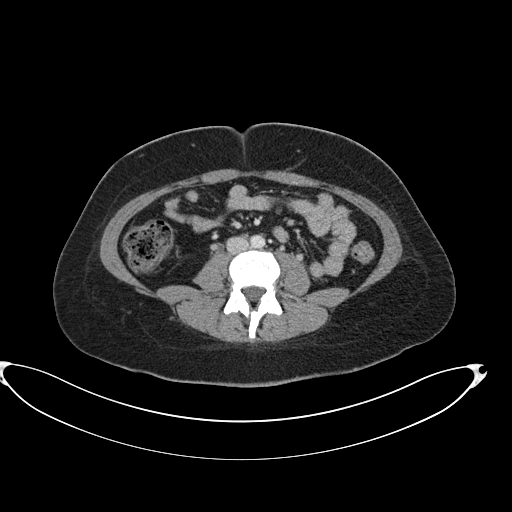
[im 54/89  soft-tissue]
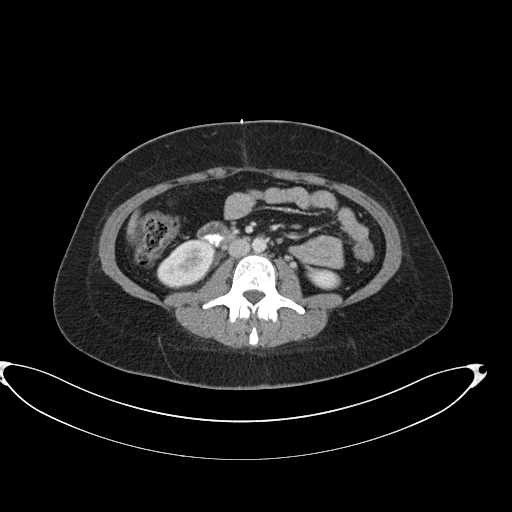
[im 54/89  bone]
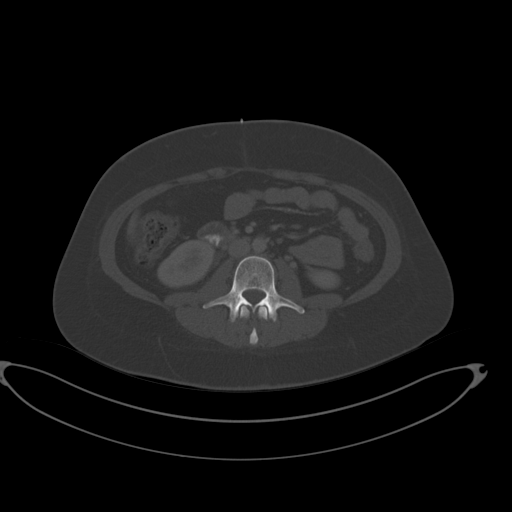
[im 59/89  soft-tissue]
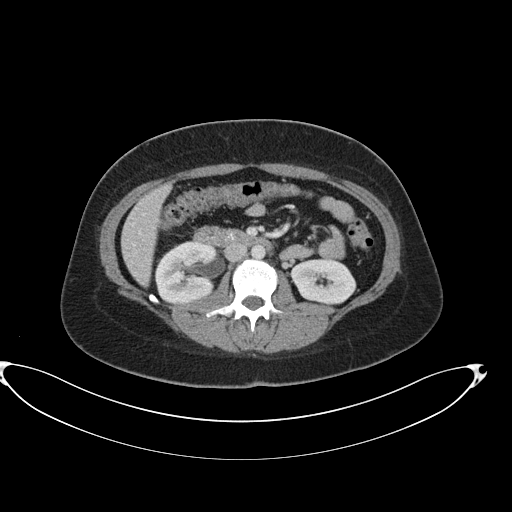
[im 64/89  soft-tissue]
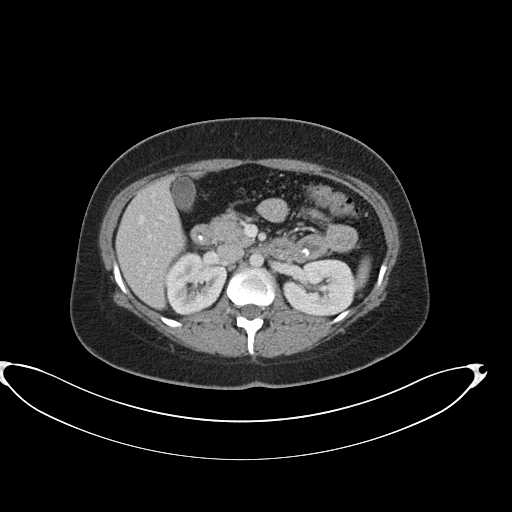
[im 69/89  soft-tissue]
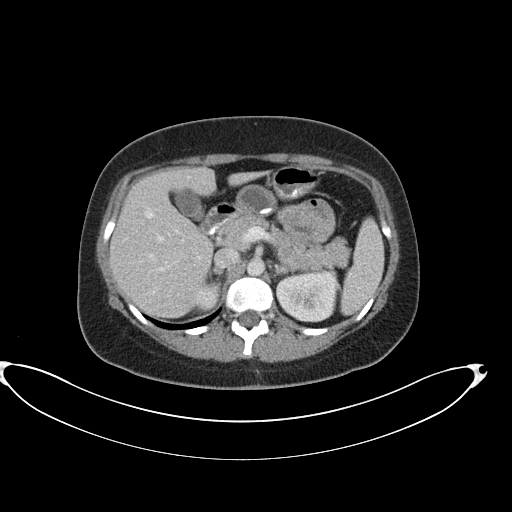
[im 79/89  soft-tissue]
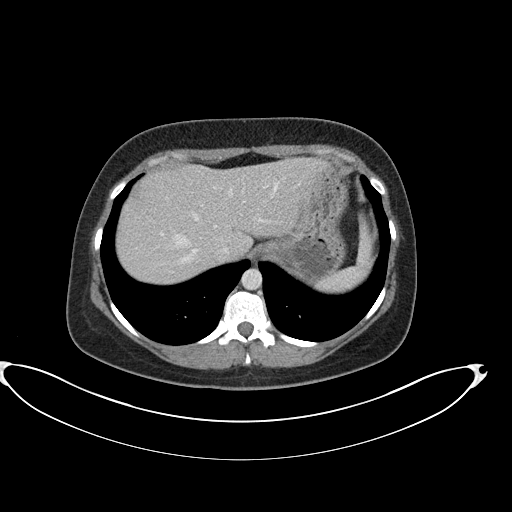
[im 84/89  soft-tissue]
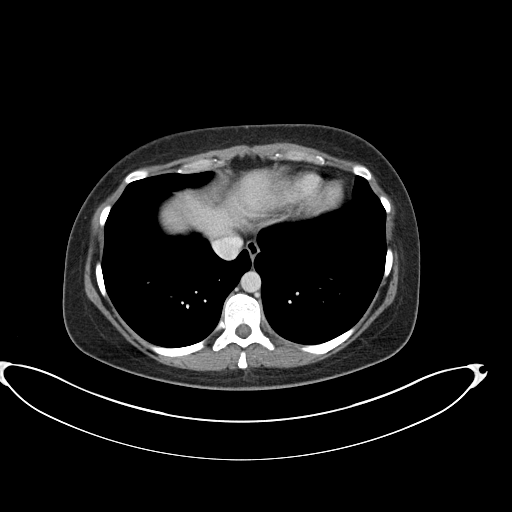

[Series 4: coronal st · coronal · 0.84mm/px · 3 of 148 slices shown]
[im 50/148  soft-tissue]
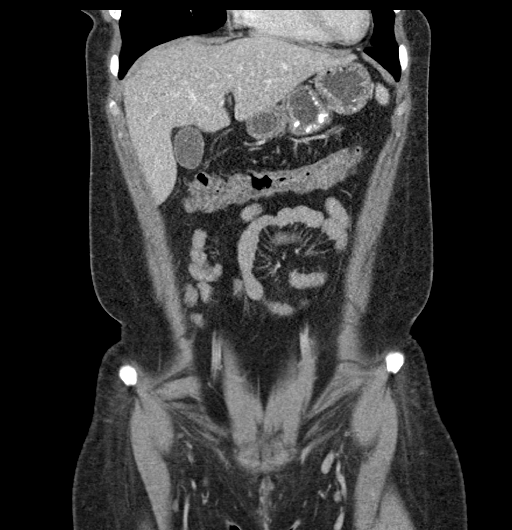
[im 66/148  soft-tissue]
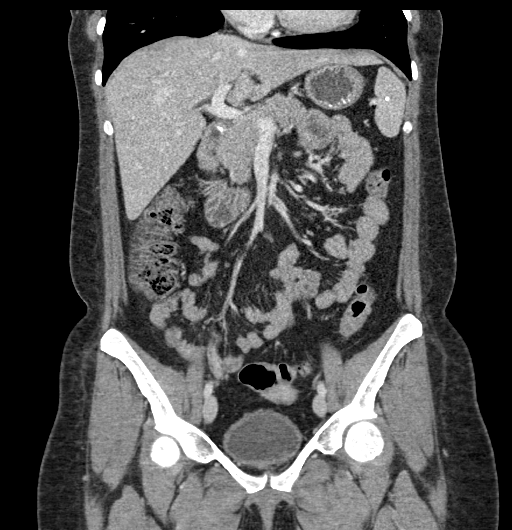
[im 82/148  soft-tissue]
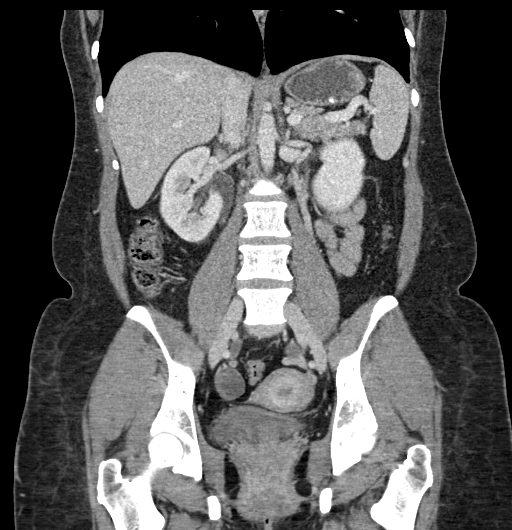

[17 of 46 positions shown; findings below may reference images not displayed]

FINDINGS: LOWER CHEST: There is no basilar pleural or apical pericardial
effusion.

HEPATOBILIARY: The hepatic contours and density are normal. There is
no intra- or extrahepatic biliary dilatation. The gallbladder is
normal.

PANCREAS: The pancreatic parenchymal contours are normal and there
is no ductal dilatation. There is no peripancreatic fluid
collection.

SPLEEN: Normal.

ADRENALS/URINARY TRACT:

--Adrenal glands: Normal.

--Right kidney/ureter: There is a stone within the ureterovesical
junction measuring 3 mm, causing mild hydroureteronephrosis and no
perinephric stranding.

--Left kidney/ureter: No hydronephrosis, nephroureterolithiasis,
perinephric stranding or solid renal mass.

--Urinary bladder: Normal for degree of distention

STOMACH/BOWEL:

--Stomach/Duodenum: There is no hiatal hernia or other gastric
abnormality. The duodenal course and caliber are normal.

--Small bowel: No dilatation or inflammation.

--Colon: No focal abnormality.

--Appendix: Normal.

VASCULAR/LYMPHATIC: Normal course and caliber of the major abdominal
vessels. No abdominal or pelvic lymphadenopathy.

REPRODUCTIVE: Normal uterus and ovaries.

MUSCULOSKELETAL. No bony spinal canal stenosis or focal osseous
abnormality.

OTHER: None.
IMPRESSION: 1. Right obstructive uropathy with 3 mm stone at the ureterovesical
junction causing mild hydroureteronephrosis.
2. No nephrolithiasis.
3. Normal appendix.

## 2020-07-10 ENCOUNTER — Other Ambulatory Visit: Payer: Self-pay

## 2020-07-10 ENCOUNTER — Encounter (HOSPITAL_COMMUNITY): Payer: Self-pay

## 2020-07-10 ENCOUNTER — Emergency Department (HOSPITAL_COMMUNITY)
Admission: EM | Admit: 2020-07-10 | Discharge: 2020-07-10 | Disposition: A | Discharge status: Home / Self Care | Payer: Medicaid Other | Attending: Emergency Medicine | Admitting: Emergency Medicine

## 2020-07-10 DIAGNOSIS — R638 Other symptoms and signs concerning food and fluid intake: Secondary | ICD-10-CM | POA: Diagnosis not present

## 2020-07-10 DIAGNOSIS — R112 Nausea with vomiting, unspecified: Secondary | ICD-10-CM | POA: Diagnosis not present

## 2020-07-10 DIAGNOSIS — R55 Syncope and collapse: Secondary | ICD-10-CM | POA: Insufficient documentation

## 2020-07-10 LAB — URINALYSIS, ROUTINE W REFLEX MICROSCOPIC
Bilirubin Urine: NEGATIVE
Glucose, UA: NEGATIVE mg/dL
Ketones, ur: 5 mg/dL — AB
Nitrite: NEGATIVE
Protein, ur: NEGATIVE mg/dL
Specific Gravity, Urine: 1.001 — ABNORMAL LOW (ref 1.005–1.030)
pH: 7 (ref 5.0–8.0)

## 2020-07-10 LAB — BASIC METABOLIC PANEL
Anion gap: 11 (ref 5–15)
BUN: 6 mg/dL (ref 6–20)
CO2: 26 mmol/L (ref 22–32)
Calcium: 9.3 mg/dL (ref 8.9–10.3)
Chloride: 103 mmol/L (ref 98–111)
Creatinine, Ser: 0.69 mg/dL (ref 0.44–1.00)
GFR calc Af Amer: >60 mL/min (ref >60)
GFR calc non Af Amer: >60 mL/min (ref >60)
Glucose, Bld: 107 mg/dL — ABNORMAL HIGH (ref 70–99)
Potassium: 3.5 mmol/L (ref 3.5–5.1)
Sodium: 140 mmol/L (ref 135–145)

## 2020-07-10 LAB — CBC
HCT: 41.4 % (ref 36.0–46.0)
Hemoglobin: 13 g/dL (ref 12.0–15.0)
MCH: 26.4 pg (ref 26.0–34.0)
MCHC: 31.4 g/dL (ref 30.0–36.0)
MCV: 84 fL (ref 80.0–100.0)
Platelets: 159 10*3/uL (ref 150–400)
RBC: 4.93 MIL/uL (ref 3.87–5.11)
RDW: 13.9 % (ref 11.5–15.5)
WBC: 7.5 10*3/uL (ref 4.0–10.5)
nRBC: 0 % (ref 0.0–0.2)

## 2020-07-10 LAB — I-STAT BETA HCG BLOOD, ED (MC, WL, AP ONLY): I-stat hCG, quantitative: <5 m[IU]/mL (ref <5)

## 2020-07-10 MED ORDER — PROMETHAZINE HCL 12.5 MG PO TABS: 12.5000 mg | ORAL_TABLET | Freq: Four times a day (QID) | ORAL | 0 refills | Status: DC | PRN

## 2020-07-10 MED ORDER — SODIUM CHLORIDE 0.9 % IV BOLUS
1000.0000 mL | Freq: Once | INTRAVENOUS | Status: AC
  Administered 2020-07-10: 1000 mL via INTRAVENOUS

## 2020-07-10 MED ORDER — ONDANSETRON 4 MG PO TBDP
4.0000 mg | ORAL_TABLET | Freq: Once | ORAL | Status: AC | PRN
  Administered 2020-07-10: 4 mg via ORAL
  Filled 2020-07-10: qty 1

## 2020-07-10 MED ORDER — PROMETHAZINE HCL 12.5 MG PO TABS: 12.5000 mg | ORAL_TABLET | Freq: Four times a day (QID) | ORAL | 0 refills | Status: AC | PRN

## 2020-07-10 MED ORDER — PROMETHAZINE HCL 25 MG/ML IJ SOLN
12.5000 mg | Freq: Once | INTRAMUSCULAR | Status: AC
  Administered 2020-07-10: 12.5 mg via INTRAVENOUS
  Filled 2020-07-10: qty 1

## 2020-07-10 NOTE — ED Notes (Signed)
Pt sleeping; rise and fall of chest noted. Pt was able to tolerate sips of OJ.

## 2020-07-10 NOTE — Discharge Instructions (Signed)
You were seen in the emergency department today for decreased appetite/intake, vomiting, and increased stress with episodes of passing out. Your work-up in the emergency department was overall reassuring. Your labs did not show any problems with your kidneys, liver, or electrolytes. You were given fluids in the emergency department.  We are sending home with Phenergan to take every 6 hours as needed for nausea and vomiting. Please start taking your outpatient medications again to assist with your sleep and anxiety.  We have prescribed you new medication(s) today. Discuss the medications prescribed today with your pharmacist as they can have adverse effects and interactions with your other medicines including over the counter and prescribed medications. Seek medical evaluation if you start to experience new or abnormal symptoms after taking one of these medicines, seek care immediately if you start to experience difficulty breathing, feeling of your throat closing, facial swelling, or rash as these could be indications of a more serious allergic reaction  Please be try to drink plenty of fluids, you may try something like Ensure if you are having trouble with nutrient intake, ideally please try to eat three meals per day.  Follow-up with your primary care provider as well as behavioral health within the next 3 days. Return to the ER for new or worsening symptoms including but not limited to recurrence of passing out, chest pain, fever, blood in vomit or stool, inability to keep fluids down, or any other concerns.

## 2020-07-10 NOTE — ED Provider Notes (Signed)
[facility] EMERGENCY DEPARTMENT Provider Note   CSN: [id number] Arrival date & time: 07/10/20  5465     History Chief Complaint  Patient presents with  . Loss of Consciousness    Cindy Mahoney is a 29 y.o. female with a history of anxiety who presents to the emergency department requesting IV fluids.  She states that she has been moving into a hotel over the past 1 week and has had significantly increased stress, poor sleep, poor appetite with fairly minimal p.o. intake as well as some nausea and vomiting.  She states her stomach often feels upset.  She relays a lot of these problems are chronic for her, they have been aggravated by her stress with moving, no alleviating factors.  She has not been taking her hydroxyzine that she is prescribed as it makes her sleepy and she needs to be doing things during the day with the move.  She states that she had 2 episodes over the past week where she became quite lightheaded and passed out.  This is happened of her before in the past.  She is in the emergency department as she would like to receive some fluids for hydration.  She denies suicidal ideation, homicidal ideation, depression, chest pain, dyspnea, focal numbness/weakness, melena, hematochezia, or dysuria.   HPI     Past Medical History:  Diagnosis Date  . Anxiety 2010    There are no problems to display for this patient.   History reviewed. No pertinent surgical history.   OB History    Gravida  1   Para      Term      Preterm      AB      Living        SAB      TAB      Ectopic      Multiple      Live Births              No family history on file.  Social History   Tobacco Use  . Smoking status: Never Smoker  . Smokeless tobacco: Never Used  Vaping Use  . Vaping Use: Never used  Substance Use Topics  . Alcohol use: Not Currently    Comment: occasionally  . Drug use: Yes    Frequency: 3.0 times per week    Types:  Marijuana    Home Medications Prior to Admission medications   Medication Sig Start Date End Date Taking? Authorizing Provider  acetaminophen (TYLENOL) 500 MG tablet Take 250 mg by mouth every 6 (six) hours as needed for moderate pain.    [provider]  dicyclomine (BENTYL) 20 MG tablet Take 1 tablet (20 mg total) by mouth every 8 (eight) hours as needed for spasms (Abdominal cramping). 01/24/19   Ward, Layla Maw, DO  HYDROcodone-acetaminophen (NORCO/VICODIN) 5-325 MG tablet Take 1 tablet by mouth every 6 (six) hours as needed for severe pain. 02/26/19   Cristina Gong, PA-C  ondansetron (ZOFRAN ODT) 8 MG disintegrating tablet 8mg  ODT q4 hours prn nausea 02/25/19   Muthersbaugh, 02/27/19, PA-C  sertraline (ZOLOFT) 25 MG tablet Take 25 mg by mouth daily.    [provider]  tamsulosin (FLOMAX) 0.4 MG CAPS capsule Take 1 capsule (0.4 mg total) by mouth 2 (two) times daily. 02/25/19   Muthersbaugh, 02/27/19, PA-C    Allergies    Patient has no known allergies.  Review of Systems   Review of Systems  Constitutional: Negative for chills and fever.  Respiratory: Negative for shortness of breath.   Cardiovascular: Negative for chest pain.  Gastrointestinal: Positive for abdominal pain, nausea and vomiting. Negative for anal bleeding, blood in stool and constipation.  Genitourinary: Negative for dysuria.  Neurological: Positive for syncope and light-headedness. Negative for weakness (focal) and numbness.  Psychiatric/Behavioral: Negative for dysphoric mood, hallucinations and suicidal ideas. The patient is nervous/anxious.   All other systems reviewed and are negative.   Physical Exam Updated Vital Signs BP 115/84 (BP Location: Left Arm)   Pulse 80   Temp 98 F (36.7 C) (Oral)   Resp 12   Ht 5\' 2"  (1.575 m)   Wt 81.6 kg   SpO2 100%   BMI 32.92 kg/m   Physical Exam Vitals and nursing note reviewed.  Constitutional:      General: She is not in acute distress.     Appearance: She is well-developed. She is not toxic-appearing.  HENT:     Head: Normocephalic and atraumatic.  Eyes:     General:        Right eye: No discharge.        Left eye: No discharge.     Conjunctiva/sclera: Conjunctivae normal.  Cardiovascular:     Rate and Rhythm: Normal rate and regular rhythm.     Heart sounds: No murmur heard.   Pulmonary:     Effort: Pulmonary effort is normal. No respiratory distress.     Breath sounds: Normal breath sounds. No wheezing, rhonchi or rales.  Abdominal:     General: There is no distension.     Palpations: Abdomen is soft.     Tenderness: There is no abdominal tenderness. There is no guarding or rebound.     Comments: Negative Murphy's and McBurney's.  Musculoskeletal:     Cervical back: Neck supple.  Skin:    General: Skin is warm and dry.     Findings: No rash.  Neurological:     Mental Status: She is alert.     Comments: Clear speech.  CN III through II grossly intact.  Strength and sensation grossly intact.  Patient is ambulatory.  Psychiatric:        Behavior: Behavior normal.     ED Results / Procedures / Treatments   Labs (all labs ordered are listed, but only abnormal results are displayed) Labs Reviewed  BASIC METABOLIC PANEL - Abnormal; Notable for the following components:      Result Value   Glucose, Bld 107 (*)    All other components within normal limits  URINALYSIS, ROUTINE W REFLEX MICROSCOPIC - Abnormal; Notable for the following components:   Color, Urine STRAW (*)    Specific Gravity, Urine 1.001 (*)    Hgb urine dipstick SMALL (*)    Ketones, ur 5 (*)    Leukocytes,Ua TRACE (*)    Bacteria, UA RARE (*)    All other components within normal limits  CBC  CBG MONITORING, ED  I-STAT BETA HCG BLOOD, ED ([facility], [facility], [facility] ONLY)    EKG None  Radiology No results found.  Procedures Procedures (including critical care time)  Medications Ordered in ED Medications  sodium chloride 0.9 % bolus 1,000 mL  (has no administration in time range)  promethazine (PHENERGAN) injection 12.5 mg (has no administration in time range)  ondansetron (ZOFRAN-ODT) disintegrating tablet 4 mg (4 mg Oral Given 07/10/20 1041)    ED Course  I have reviewed the triage vital signs and the nursing notes.  Pertinent labs & imaging results that were available during my care of the patient were reviewed by me and considered in my medical decision making (see chart for details).    MDM Rules/Calculators/A&P                         Patient presents to the ED requesting fluids as she has had poor p.o. intake and 2 syncopal episodes over the past 1 week.  On arrival patient is nontoxic and resting comfortably, her vitals are within normal limits.  She has a relatively benign physical exam.  She is no focal neurologic deficits.  Heart regular rate and rhythm.  Additional history obtained:  Additional history obtained from chart review and nursing note reviewed  EKG: no STEMI or significant concerning abnormality.   Lab Tests:  I reviewed and interpreted labs by triage, which included:  CBC, CMP, pregnancy test, and urinalysis: Fairly unremarkable, patient not pregnant, no significant electrolyte derangement, renal function preserved.  Patient's abdominal exam is unremarkable, no tenderness, no peritoneal signs, do not suspect acute surgical process. No concerning chest pain, palpitations, or dyspnea associated with her syncopal episodes, history of similar, EKG fairly reassuring, overall low risk in this regard, likely due to decreased p.o. intake. Her labs do not show evidence of significant malnourishment.  she is tolerating p.o. in the emergency department. No SI, HI, or acute psychosis to require emergent psychiatric admission. She has received fluids with some improvement. Encouraged her to restart her medications that she is prescribed for sleep &anxiety. We will discharge home with symptomatic care and outpatient  resources.I discussed results, treatment plan, need for follow-up, and return precautions with the patient. Provided opportunity for questions, patient confirmed understanding and is in agreement with plan.   Portions of this note were generated with Scientist, clinical (histocompatibility and immunogenetics). Dictation errors may occur despite best attempts at proofreading.  Final Clinical Impression(s) / ED Diagnoses Final diagnoses:  Syncope, unspecified syncope type  Decreased oral intake    Rx / DC Orders ED Discharge Orders         Ordered    promethazine (PHENERGAN) 12.5 MG tablet  Every 6 hours PRN        07/10/20 1455           Kotaro Buer, [place], PA-C 07/10/20 1505    Tilden Fossa, MD 07/11/20 1317

## 2020-07-10 NOTE — ED Triage Notes (Signed)
Patient complains of anxiety, decreased sleep and minimal intake x 4-6 days. Patient states with her little intake unable to take anxiety meds. Reports syncopal episode x 2 in the past day. Alert and oriented. Patient denies suicidal thoughts.

## 2020-07-10 NOTE — ED Notes (Signed)
Reviewed discharge instructions with patient. Follow-up care and medications reviewed. Patient verbalized understanding. Patient A&Ox4, VSS, upon discharge. 

## 2020-07-10 NOTE — ED Notes (Signed)
Provided pt w/ OJ for fluid challenge

## 2020-12-31 ENCOUNTER — Encounter (HOSPITAL_COMMUNITY): Payer: Self-pay

## 2020-12-31 ENCOUNTER — Emergency Department (HOSPITAL_COMMUNITY)
Admission: EM | Admit: 2020-12-31 | Discharge: 2020-12-31 | Disposition: A | Discharge status: Home / Self Care | Payer: Medicaid Other | Attending: Emergency Medicine | Admitting: Emergency Medicine

## 2020-12-31 ENCOUNTER — Other Ambulatory Visit: Payer: Self-pay

## 2020-12-31 DIAGNOSIS — R112 Nausea with vomiting, unspecified: Secondary | ICD-10-CM | POA: Insufficient documentation

## 2020-12-31 DIAGNOSIS — F419 Anxiety disorder, unspecified: Secondary | ICD-10-CM | POA: Diagnosis not present

## 2020-12-31 DIAGNOSIS — R1012 Left upper quadrant pain: Secondary | ICD-10-CM | POA: Insufficient documentation

## 2020-12-31 DIAGNOSIS — R110 Nausea: Secondary | ICD-10-CM

## 2020-12-31 MED ORDER — HYOSCYAMINE SULFATE SL 0.125 MG SL SUBL: 1.0000 | SUBLINGUAL_TABLET | Freq: Four times a day (QID) | SUBLINGUAL | 0 refills | Status: AC | PRN

## 2020-12-31 MED ORDER — HYOSCYAMINE SULFATE 0.125 MG SL SUBL
0.2500 mg | SUBLINGUAL_TABLET | Freq: Once | SUBLINGUAL | Status: AC
  Administered 2020-12-31: 0.25 mg via SUBLINGUAL
  Filled 2020-12-31: qty 2

## 2020-12-31 MED ORDER — ONDANSETRON 4 MG PO TBDP
4.0000 mg | ORAL_TABLET | Freq: Once | ORAL | Status: AC
  Administered 2020-12-31: 4 mg via ORAL
  Filled 2020-12-31: qty 1

## 2020-12-31 MED ORDER — ALUM & MAG HYDROXIDE-SIMETH 200-200-20 MG/5ML PO SUSP
30.0000 mL | Freq: Once | ORAL | Status: AC
  Administered 2020-12-31: 30 mL via ORAL
  Filled 2020-12-31: qty 30

## 2020-12-31 NOTE — ED Provider Notes (Signed)
[facility]-EMERGENCY DEPT Provider Note  CSN: [id number] Arrival date & time: 12/31/20 0150  Chief Complaint(s) Vomiting  HPI Cindy Mahoney is a 30 y.o. adult here for increased anxiety with nausea nonbloody nonbilious emesis.  Patient still able to tolerate oral fluids.  Endorsing mild left upper quadrant abdominal discomfort related to the emesis.  Currently only having mild discomfort.  Denies any recent fevers or infections.  No coughing or congestion.  No diarrhea.  No constipation.  Still having bowel movements and passing gas.  Denies any known sick contacts.  Endorses marijuana use.  HPI  Past Medical History Past Medical History:  Diagnosis Date  . Anxiety 2010   There are no problems to display for this patient.  Home Medication(s) Prior to Admission medications   Medication Sig Start Date End Date Taking? Authorizing Provider  acetaminophen (TYLENOL) 500 MG tablet Take 250 mg by mouth every 6 (six) hours as needed for moderate pain.   Yes [provider]  hydrOXYzine (ATARAX/VISTARIL) 50 MG tablet Take 50-100 mg by mouth 3 (three) times daily as needed for anxiety.   Yes [provider]  Hyoscyamine Sulfate SL (LEVSIN/SL) 0.125 MG SUBL Place 1 each under the tongue 4 (four) times daily as needed for up to 5 days. 12/31/20 01/05/21 Yes Cire Deyarmin, Amadeo Garnet, MD  promethazine (PHENERGAN) 12.5 MG tablet Take 1 tablet (12.5 mg total) by mouth every 6 (six) hours as needed for nausea or vomiting. 07/10/20  Yes Petrucelli, Samantha R, PA-C  traZODone (DESYREL) 150 MG tablet Take 150 mg by mouth at bedtime.   Yes [provider]                                                                                                                                    Past Surgical History History reviewed. No pertinent surgical history. Family History No family history on file.  Social History Social History   Tobacco Use  . Smoking  status: Never Smoker  . Smokeless tobacco: Never Used  Vaping Use  . Vaping Use: Never used  Substance Use Topics  . Alcohol use: Not Currently    Comment: occasionally  . Drug use: Yes    Frequency: 3.0 times per week    Types: Marijuana   Allergies Patient has no known allergies.  Review of Systems Review of Systems All other systems are reviewed and are negative for acute change except as noted in the HPI  Physical Exam Vital Signs  I have reviewed the triage vital signs BP 108/69   Pulse (!) 55   Temp 98.3 F (36.8 C) (Oral)   Resp 16   Ht 5\' 2"  (1.575 m)   Wt 63 kg   SpO2 98%   BMI 25.40 kg/m   Physical Exam Vitals reviewed.  Constitutional:      General: "Cindy Mahoney" is not in acute distress.  Appearance: Cindy Mahoney "Cindy Mahoney" is well-developed. Cindy Mahoney "Cindy Mahoney" is not diaphoretic.  HENT:     Head: Normocephalic and atraumatic.     Jaw: No trismus.     Right Ear: External ear normal.     Left Ear: External ear normal.     Nose: Nose normal.  Eyes:     General: No scleral icterus.    Conjunctiva/sclera: Conjunctivae normal.  Neck:     Trachea: Phonation normal.  Cardiovascular:     Rate and Rhythm: Normal rate and regular rhythm.  Pulmonary:     Effort: Pulmonary effort is normal. No respiratory distress.     Breath sounds: No stridor.  Abdominal:     General: There is no distension.     Tenderness: There is no abdominal tenderness.  Musculoskeletal:        General: Normal range of motion.     Cervical back: Normal range of motion.  Neurological:     Mental Status: Cindy Mahoney "Cindy Mahoney" is alert and oriented to person, place, and time.  Psychiatric:        Behavior: Behavior normal.     ED Results and Treatments Labs (all labs ordered are listed, but only abnormal results are displayed) Labs Reviewed - No data to display                                                                                                                        EKG  EKG Interpretation  Date/Time:    Ventricular Rate:    PR Interval:    QRS Duration:   QT Interval:    QTC Calculation:   R Axis:     Text Interpretation:        Radiology No results found.  Pertinent labs & imaging results that were available during my care of the patient were reviewed by me and considered in my medical decision making (see chart for details).  Medications Ordered in ED Medications  ondansetron (ZOFRAN-ODT) disintegrating tablet 4 mg (4 mg Oral Given 12/31/20 0412)  hyoscyamine (LEVSIN SL) SL tablet 0.25 mg (0.25 mg Sublingual Given 12/31/20 0411)  alum & mag hydroxide-simeth (MAALOX/MYLANTA) 200-200-20 MG/5ML suspension 30 mL (30 mLs Oral Given 12/31/20 0411)  Procedures Procedures  (including critical care time)  Medical Decision Making / ED Course I have reviewed the nursing notes for this encounter and the patient's prior records (if available in EHR or on provided paperwork).   Sidonie Dexheimer was evaluated in Emergency Department on 12/31/2020 for the symptoms described in the history of present illness. Cindy Mahoney "Cindy Mahoney" was evaluated in the context of the global COVID-19 pandemic, which necessitated consideration that the patient might be at risk for infection with the SARS-CoV-2 virus that causes COVID-19. Institutional protocols and algorithms that pertain to the evaluation of patients at risk for COVID-19 are in a state of rapid change based on information released by regulatory bodies including the CDC and federal and state organizations. These policies and algorithms were followed during the patient's care in the ED.  Anxiety induced nausea and emesis. Abdomen benign. Treated symptomatically with GI cocktail and Levsin providing significant release.  Able to tolerate  oral intake.  Doubt serious intra-abdominal inflammatory/infectious process. No need for labs or imaging at this time.      Final Clinical Impression(s) / ED Diagnoses Final diagnoses:  Anxiety  Nausea    The patient appears reasonably screened and/or stabilized for discharge and I doubt any other medical condition or other [facility] requiring further screening, evaluation, or treatment in the ED at this time prior to discharge. Safe for discharge with strict return precautions.  Disposition: Discharge  Condition: Good  I have discussed the results, Dx and Tx plan with the patient/family who expressed understanding and agree(s) with the plan. Discharge instructions discussed at length. The patient/family was given strict return precautions who verbalized understanding of the instructions. No further questions at time of discharge.    ED Discharge Orders         Ordered    Hyoscyamine Sulfate SL (LEVSIN/SL) 0.125 MG SUBL  4 times daily PRN        12/31/20 0610          Follow Up: Primary care provider  Call  as needed     This chart was dictated using voice recognition software.  Despite best efforts to proofread,  errors can occur which can change the documentation meaning.   Nira Conn, MD 12/31/20 [phone]

## 2020-12-31 NOTE — ED Triage Notes (Signed)
Pt c/o vomiting since Saturday.

## 2021-01-09 ENCOUNTER — Other Ambulatory Visit: Payer: Self-pay

## 2021-01-09 ENCOUNTER — Encounter (HOSPITAL_COMMUNITY): Payer: Self-pay

## 2021-01-09 ENCOUNTER — Emergency Department (HOSPITAL_COMMUNITY)
Admission: EM | Admit: 2021-01-09 | Discharge: 2021-01-09 | Disposition: A | Discharge status: Home / Self Care | Payer: Medicaid Other | Attending: Emergency Medicine | Admitting: Emergency Medicine

## 2021-01-09 DIAGNOSIS — R448 Other symptoms and signs involving general sensations and perceptions: Secondary | ICD-10-CM | POA: Diagnosis not present

## 2021-01-09 DIAGNOSIS — R112 Nausea with vomiting, unspecified: Secondary | ICD-10-CM

## 2021-01-09 DIAGNOSIS — R1012 Left upper quadrant pain: Secondary | ICD-10-CM | POA: Insufficient documentation

## 2021-01-09 LAB — COMPREHENSIVE METABOLIC PANEL
ALT: 9 U/L (ref 0–44)
AST: 17 U/L (ref 15–41)
Albumin: 4.3 g/dL (ref 3.5–5.0)
Alkaline Phosphatase: 39 U/L (ref 38–126)
Anion gap: 7 (ref 5–15)
BUN: 7 mg/dL (ref 6–20)
CO2: 23 mmol/L (ref 22–32)
Calcium: 9 mg/dL (ref 8.9–10.3)
Chloride: 109 mmol/L (ref 98–111)
Creatinine, Ser: 0.53 mg/dL (ref 0.44–1.00)
GFR, Estimated: >60 mL/min (ref >60)
Glucose, Bld: 105 mg/dL — ABNORMAL HIGH (ref 70–99)
Potassium: 3.7 mmol/L (ref 3.5–5.1)
Sodium: 139 mmol/L (ref 135–145)
Total Bilirubin: 0.6 mg/dL (ref 0.3–1.2)
Total Protein: 7.7 g/dL (ref 6.5–8.1)

## 2021-01-09 LAB — RAPID URINE DRUG SCREEN, HOSP PERFORMED
Amphetamines: NOT DETECTED
Barbiturates: NOT DETECTED
Benzodiazepines: NOT DETECTED
Cocaine: NOT DETECTED
Opiates: NOT DETECTED
Tetrahydrocannabinol: POSITIVE — AB

## 2021-01-09 LAB — CBC WITH DIFFERENTIAL/PLATELET
Abs Immature Granulocytes: 0.02 10*3/uL (ref 0.00–0.07)
Basophils Absolute: 0 10*3/uL (ref 0.0–0.1)
Basophils Relative: 0 %
Eosinophils Absolute: 0 10*3/uL (ref 0.0–0.5)
Eosinophils Relative: 0 %
HCT: 40.2 % (ref 36.0–46.0)
Hemoglobin: 13.1 g/dL (ref 12.0–15.0)
Immature Granulocytes: 0 %
Lymphocytes Relative: 16 %
Lymphs Abs: 1.2 10*3/uL (ref 0.7–4.0)
MCH: 27.6 pg (ref 26.0–34.0)
MCHC: 32.6 g/dL (ref 30.0–36.0)
MCV: 84.8 fL (ref 80.0–100.0)
Monocytes Absolute: 0.5 10*3/uL (ref 0.1–1.0)
Monocytes Relative: 6 %
Neutro Abs: 5.9 10*3/uL (ref 1.7–7.7)
Neutrophils Relative %: 78 %
Platelets: 156 10*3/uL (ref 150–400)
RBC: 4.74 MIL/uL (ref 3.87–5.11)
RDW: 13.4 % (ref 11.5–15.5)
WBC: 7.7 10*3/uL (ref 4.0–10.5)
nRBC: 0 % (ref 0.0–0.2)

## 2021-01-09 LAB — LIPASE, BLOOD: Lipase: 39 U/L (ref 11–51)

## 2021-01-09 MED ORDER — PROCHLORPERAZINE EDISYLATE 10 MG/2ML IJ SOLN
10.0000 mg | Freq: Once | INTRAMUSCULAR | Status: AC
  Administered 2021-01-09: 10 mg via INTRAVENOUS
  Filled 2021-01-09: qty 2

## 2021-01-09 MED ORDER — SODIUM CHLORIDE 0.9 % IV BOLUS
1000.0000 mL | Freq: Once | INTRAVENOUS | Status: AC
  Administered 2021-01-09: 1000 mL via INTRAVENOUS

## 2021-01-09 MED ORDER — HALOPERIDOL LACTATE 5 MG/ML IJ SOLN
2.0000 mg | Freq: Once | INTRAMUSCULAR | Status: AC
  Administered 2021-01-09: 2 mg via INTRAVENOUS
  Filled 2021-01-09: qty 1

## 2021-01-09 MED ORDER — ONDANSETRON 8 MG PO TBDP
8.0000 mg | ORAL_TABLET | Freq: Once | ORAL | Status: AC
  Administered 2021-01-09: 8 mg via ORAL
  Filled 2021-01-09: qty 1

## 2021-01-09 NOTE — ED Notes (Signed)
Effie Shy MD to bedside at this time

## 2021-01-09 NOTE — ED Notes (Signed)
Partner to bedside at this time

## 2021-01-09 NOTE — Discharge Instructions (Addendum)
Gradually advance the diet.  Follow-up with a primary care doctor and gastroenterologist as soon as possible.  Call the number listed for GI.

## 2021-01-09 NOTE — ED Notes (Signed)
Patient ambulated to bathroom at this time, stable gait 

## 2021-01-09 NOTE — ED Provider Notes (Signed)
[facility]-EM[facility] DEPT Provider Note   CSN: [id number] Arrival date & time: 01/09/21  [id number]     History Chief Complaint  Patient presents with  . Emesis    Lilyan Puntonja Elmer SowMarie Wageman is a 30 y.o. adult.  HPI Patient is presenting for evaluation of nausea and vomiting and upper abdominal pain, he points to the left side, of the upper abdomen, as the site of pain.  He communicates through texting, and by his partner allowing to speak to medical providers.  See documentation in this record.  Symptoms are similar to when seen in the ED about a month ago, by report of patient and partner.  There are no other known active modifying factors    Past Medical History:  Diagnosis Date  . Anxiety 2010    There are no problems to display for this patient.   History reviewed. No pertinent surgical history.   OB History    Gravida  1   Para      Term      Preterm      AB      Living        SAB      IAB      Ectopic      Multiple      Live Births              No family history on file.  Social History   Tobacco Use  . Smoking status: Never Smoker  . Smokeless tobacco: Never Used  Vaping Use  . Vaping Use: Never used  Substance Use Topics  . Alcohol use: Not Currently    Comment: occasionally  . Drug use: Yes    Frequency: 3.0 times per week    Types: Marijuana    Home Medications Prior to Admission medications   Medication Sig Start Date End Date Taking? Authorizing Provider  acetaminophen (TYLENOL) 500 MG tablet Take 250 mg by mouth every 6 (six) hours as needed for moderate pain.    [provider]  hydrOXYzine (ATARAX/VISTARIL) 50 MG tablet Take 50-100 mg by mouth 3 (three) times daily as needed for anxiety.    [provider]  Hyoscyamine Sulfate SL (LEVSIN/SL) 0.125 MG SUBL Place 1 each under the tongue 4 (four) times daily as needed for up to 5 days. 12/31/20 01/05/21  Nira Connardama, Pedro Eduardo, MD  promethazine  (PHENERGAN) 12.5 MG tablet Take 1 tablet (12.5 mg total) by mouth every 6 (six) hours as needed for nausea or vomiting. 07/10/20   Petrucelli, Lelon MastSamantha R, PA-C  traZODone (DESYREL) 150 MG tablet Take 150 mg by mouth at bedtime.    [provider]    Allergies    Patient has no known allergies.  Review of Systems   Review of Systems  All other systems reviewed and are negative.   Physical Exam Updated Vital Signs BP 106/68   Pulse 76   Temp 98.2 F (36.8 C) (Oral)   Resp 18   SpO2 98%   Breastfeeding Unknown Comment: Currently in transition-taking testosterone.   Physical Exam Vitals and nursing note reviewed.  Constitutional:      General: Domenica Failonja Marie Godley "Francee PiccoloDerek" is not in acute distress.    Appearance: Domenica Failonja Marie Santosuosso "Francee PiccoloDerek" is well-developed. Domenica Failonja Marie Brisk "Francee PiccoloDerek" is not ill-appearing, toxic-appearing or diaphoretic.  HENT:     Head: Normocephalic and atraumatic.     Right Ear: External ear normal.     Left Ear: External ear normal.  Eyes:     Conjunctiva/sclera: Conjunctivae normal.     Pupils: Pupils are equal, round, and reactive to light.  Neck:     Trachea: Phonation normal.  Cardiovascular:     Rate and Rhythm: Normal rate.  Pulmonary:     Effort: Pulmonary effort is normal.  Abdominal:     General: There is no distension.     Tenderness: There is no abdominal tenderness.  Musculoskeletal:        General: No swelling. Normal range of motion.     Cervical back: Normal range of motion and neck supple.  Skin:    General: Skin is warm and dry.     Coloration: Skin is not jaundiced or pale.  Neurological:     Mental Status: Domenica Fail "[name]" is alert and oriented to person, place, and time.     Cranial Nerves: No cranial nerve deficit.     Sensory: Sensory deficit present.     Motor: No abnormal muscle tone.     Coordination: Coordination normal.  Psychiatric:     Comments: Unusual behavior, communicating reluctantly but  able to communicate.     ED Results / Procedures / Treatments   Labs (all labs ordered are listed, but only abnormal results are displayed) Labs Reviewed  COMPREHENSIVE METABOLIC PANEL - Abnormal; Notable for the following components:      Result Value   Glucose, Bld 105 (*)    All other components within normal limits  RAPID URINE DRUG SCREEN, HOSP PERFORMED - Abnormal; Notable for the following components:   Tetrahydrocannabinol POSITIVE (*)    All other components within normal limits  CBC WITH DIFFERENTIAL/PLATELET  LIPASE, BLOOD    EKG EKG Interpretation  Date/Time:  Thursday January 09 2021 08:33:34 EDT Ventricular Rate:  55 PR Interval:  148 QRS Duration: 87 QT Interval:  398 QTC Calculation: 381 R Axis:   69 Text Interpretation: Sinus rhythm ST elev, probable normal early repol pattern since last tracing no significant change Confirmed by Mancel Bale [phone]) on 01/09/2021 8:57:22 AM   Radiology No results found.  Procedures Procedures   Medications Ordered in ED Medications  sodium chloride 0.9 % bolus 1,000 mL (0 mLs Intravenous Stopped 01/09/21 1005)  haloperidol lactate (HALDOL) injection 2 mg (2 mg Intravenous Given 01/09/21 0913)  prochlorperazine (COMPAZINE) injection 10 mg (10 mg Intravenous Given 01/09/21 1124)  ondansetron (ZOFRAN-ODT) disintegrating tablet 8 mg (8 mg Oral Given 01/09/21 1230)    ED Course  I have reviewed the triage vital signs and the nursing notes.  Pertinent labs & imaging results that were available during my care of the patient were reviewed by me and considered in my medical decision making (see chart for details).  Clinical Course as of 01/09/21 1551  Thu Jan 09, 2021  0706 At this time, the patient is tearful and responds to my questions by pointing to his cell phone.  The text and the cell phone states "I am autistic and cannot talk right now, can I have my partner come into the room?"  At this time nursing is working on  trying to find a partner, to assist with comforting measures.  Note that previously on visits here recently, the patient has been able to communicate with his voice. [EW]  0708 Vital signs are reviewed, mild blood pressure elevation. [EW]  [phone] At this time patient's partner is in room and assisting with communication, and the patient is now able to communicate in words as responses,  but deferring to partner for discussion with me.  He has ongoing intermittent nausea and vomiting, almost daily, which does not respond to use of high-dose comedian, which was previously prescribed.  He does not have a primary care doctor or gastroenterologist for management of ongoing symptoms.  There is been no blood in the emesis.  He takes trazodone at nighttime to help with sleep, but no other regular medications. [EW]    Clinical Course User Index [EW] Mancel Bale, MD   MDM Rules/Calculators/A&P                           Patient Vitals for the past 24 hrs:  BP Temp Temp src Pulse Resp SpO2  01/09/21 1225 106/68 -- -- 76 18 98 %  01/09/21 1215 -- -- -- (!) 59 -- 99 %  01/09/21 1200 106/62 -- -- (!) 56 -- 100 %  01/09/21 1130 107/69 -- -- 80 -- 100 %  01/09/21 1115 -- -- -- (!) 54 -- 100 %  01/09/21 1100 98/64 -- -- (!) 57 -- 99 %  01/09/21 1058 115/71 -- -- 68 17 100 %  01/09/21 1045 -- -- -- 77 -- 100 %  01/09/21 1030 -- -- -- 61 -- 99 %  01/09/21 1015 -- -- -- 69 -- 100 %  01/09/21 1000 -- -- -- (!) 58 -- 100 %  01/09/21 0945 114/80 -- -- (!) 59 20 99 %  01/09/21 0930 -- -- -- (!) 45 20 100 %  01/09/21 0915 -- -- -- 65 18 100 %  01/09/21 0900 114/80 -- -- 67 (!) 9 100 %  01/09/21 0845 -- -- -- 70 (!) 9 99 %  01/09/21 0835 111/79 98.2 F (36.8 C) Oral 69 17 99 %  01/09/21 0830 -- -- -- 63 -- 100 %  01/09/21 0815 -- -- -- (!) 53 -- 99 %  01/09/21 0800 -- -- -- 60 -- 99 %  01/09/21 0745 -- -- -- (!) 53 -- 100 %  01/09/21 0730 -- -- -- (!) 57 -- 100 %  01/09/21 0715 -- -- -- (!) 102 -- 100 %   01/09/21 0700 -- -- -- (!) 118 -- 99 %  01/09/21 0645 -- -- -- 69 -- 99 %  01/09/21 0640 (!) 129/107 98.1 F (36.7 C) Oral 84 18 100 %    12:12 PM Reevaluation with update and discussion. After initial assessment and treatment, an updated evaluation reveals patient sitting up comfortable, speaking, conversant, interactive.  He states he is doing well and ready to go home, that he has everything he needs at home.  He states that his support team is working on getting him a primary care doctor.  He agrees to contact GI for consultation as well.  All questions answered. Mancel Bale   Medical Decision Making:  This patient is presenting for evaluation of recurrent nausea and vomiting, which does require a range of treatment options, and is a complaint that involves a moderate risk of morbidity and mortality. The differential diagnoses include gastroenteritis, hyperemesis cannabinoid syndrome, nonspecific vomiting. I decided to review old records, and in summary patient identifies as female, who presents for recurrent symptoms.  Previously treated with high-dose comedian, without relief..  I obtain additional historical information from partner at bedside.  Clinical Laboratory Tests Ordered, included CBC, Metabolic panel and Lipase, urine drug screen. Review indicates normal except drug screen positive for THC.   Critical Interventions-clinical evaluation,  laboratory testing, medication treatment, observation reassessment  After These Interventions, the Patient was reevaluated and was found improved and stable discharge.  Patient talking at discharge.  Nonspecific symptoms, improved.  No indication for hospitalization  CRITICAL CARE-no Performed by: Mancel Bale  Nursing Notes Reviewed/ Care Coordinated Applicable Imaging Reviewed Interpretation of Laboratory Data incorporated into ED treatment  The patient appears reasonably screened and/or stabilized for discharge and I doubt any other  medical condition or other [facility] requiring further screening, evaluation, or treatment in the ED at this time prior to discharge.  Plan: Home Medications-continue current; Home Treatments-gradually advance diet; return here if the recommended treatment, does not improve the symptoms; Recommended follow up-follow-up with PCP and GI, for further care as needed.     Final Clinical Impression(s) / ED Diagnoses Final diagnoses:  Non-intractable vomiting with nausea, unspecified vomiting type    Rx / DC Orders ED Discharge Orders    None       Mancel Bale, MD 01/09/21 1553

## 2021-01-09 NOTE — ED Triage Notes (Signed)
Patient arrived with complaints of NV and abdominal pain since 5am   Patient communication through texting on phone writing that they are non verbal.

## 2021-01-09 NOTE — ED Notes (Signed)
Patient stated that she is not able to urinate at this time

## 2021-02-26 ENCOUNTER — Encounter: Payer: Self-pay | Admitting: Physician Assistant

## 2021-03-24 ENCOUNTER — Other Ambulatory Visit (INDEPENDENT_AMBULATORY_CARE_PROVIDER_SITE_OTHER): Payer: Medicaid Other

## 2021-03-24 ENCOUNTER — Ambulatory Visit (INDEPENDENT_AMBULATORY_CARE_PROVIDER_SITE_OTHER): Payer: Medicaid Other | Admitting: Physician Assistant

## 2021-03-24 ENCOUNTER — Encounter: Payer: Self-pay | Admitting: Physician Assistant

## 2021-03-24 VITALS — BP 102/80 | HR 88 | Ht 62.0 in | Wt 135.6 lb

## 2021-03-24 DIAGNOSIS — K5900 Constipation, unspecified: Secondary | ICD-10-CM

## 2021-03-24 DIAGNOSIS — R110 Nausea: Secondary | ICD-10-CM

## 2021-03-24 DIAGNOSIS — R634 Abnormal weight loss: Secondary | ICD-10-CM

## 2021-03-24 LAB — FERRITIN: Ferritin: 15.7 ng/mL (ref 10.0–291.0)

## 2021-03-24 LAB — IBC PANEL
Iron: 73 ug/dL (ref 42–145)
Saturation Ratios: 17 % — ABNORMAL LOW (ref 20.0–50.0)
Transferrin: 306 mg/dL (ref 212.0–360.0)

## 2021-03-24 LAB — B12 AND FOLATE PANEL
Folate: 10.5 ng/mL (ref >5.9)
Vitamin B-12: 680 pg/mL (ref 211–911)

## 2021-03-24 LAB — TSH: TSH: 0.86 u[IU]/mL (ref 0.35–4.50)

## 2021-03-24 LAB — SEDIMENTATION RATE: Sed Rate: 10 mm/hr (ref 0–20)

## 2021-03-24 MED ORDER — ONDANSETRON HCL 4 MG PO TABS: 4.0000 mg | ORAL_TABLET | Freq: Four times a day (QID) | ORAL | 3 refills | Status: AC | PRN

## 2021-03-24 NOTE — Patient Instructions (Addendum)
If you are age 28 or younger, your body mass index should be between 19-25. Your Body mass index is 24.8 kg/m. If this is out of the aformentioned range listed, please consider follow up with your Primary Care Provider.  __________________________________________________________  The [facility] providers would like to encourage you to use [facility] to communicate with providers for non-urgent requests or questions.  Due to long hold times on the telephone, sending your provider a message by [facility] may be a faster and more efficient way to get a response.  Please allow 48 business hours for a response.  Please remember that this is for non-urgent requests.   Your provider has requested that you go to the basement level for lab work before leaving today. Press "B" on the elevator. The lab is located at the first door on the left as you exit the elevator.  Start Ondansetron 4 mg 1 tablet every 6 hours as needed for nausea  Start Miralax 1 capful in 8 ounces of water or juice  Increase you water intake to 40-60 ounces of water daily

## 2021-03-24 NOTE — Progress Notes (Signed)
Subjective:    Patient ID: Cindy Mahoney, adult    DOB: 03-09-1991, 30 y.o.   MRN: [id number]  HPI Cindy "Cindy Mahoney" is a 30 year old nonbinary female, new to GI today who is referred after ER visit on 01/09/2021 with intractable nausea and vomiting.  Patient has not had any prior GI evaluation. Patient comes in today accompanied by friend, does not feel that the recent emergency room visit was helpful at all.  No abdominal imaging was done, CBC c-Met and UA were all unremarkable and drug screen was positive for THC. Patient does have prior history of ureterolithiasis and had a kidney stone in 2020 on CT. Patient had been taking trazodone on an as-needed basis over the few months prior to that ER visit mostly to help with sleep, but after starting this had noticed that Cindy Mahoney was having intermittent nausea and vomiting which would occur at 4 or 5:00 in the morning with dry heaves and abdominal pain associated with vomiting.  Cindy Mahoney had also lost about 50 pounds since January 2022.  Trazodone has since been stopped and patient is no longer having any nausea or vomiting. Patient relates inconsistent bowel movements and attributes weight loss to eating less frequently in part secondary to depression and in part secondary to restrictive eating behavior.  Patient does not feel that they meet the criteria for any of the usual eating disorders, is not bulimic but definitely feels that restrictive eating is an issue.  Cindy Mahoney relates that Cindy Mahoney has to force himself to eat most of the time and is unable to eat much at a time and sometimes eating will cause him to vomit.  Cindy Mahoney is trying to get into therapy/counseling, and definitely expresses "struggles" with eating. Family history not known No prior abdominal surgery No regular aspirin or NSAIDs   Review of Systems Pertinent positive and negative review of systems were noted in the above HPI section.  All other review of systems was otherwise negative.   Outpatient  Encounter Medications as of 03/24/2021  Medication Sig   acetaminophen (TYLENOL) 500 MG tablet Take 250 mg by mouth every 6 (six) hours as needed for moderate pain.   hydrOXYzine (ATARAX/VISTARIL) 50 MG tablet Take 50-100 mg by mouth 3 (three) times daily as needed for anxiety.   ondansetron (ZOFRAN) 4 MG tablet Take 1 tablet (4 mg total) by mouth every 6 (six) hours as needed for nausea or vomiting.   promethazine (PHENERGAN) 12.5 MG tablet Take 1 tablet (12.5 mg total) by mouth every 6 (six) hours as needed for nausea or vomiting.   Hyoscyamine Sulfate SL (LEVSIN/SL) 0.125 MG SUBL Place 1 each under the tongue 4 (four) times daily as needed for up to 5 days.   [DISCONTINUED] traZODone (DESYREL) 150 MG tablet Take 150 mg by mouth at bedtime. (Patient not taking: Reported on 03/24/2021)   No facility-administered encounter medications on file as of 03/24/2021.   No Known Allergies There are no problems to display for this patient.  Social History   Socioeconomic History   Marital status: Single    Spouse name: Not on file   Number of children: Not on file   Years of education: Not on file   Highest education level: Not on file  Occupational History   Not on file  Tobacco Use   Smoking status: Never   Smokeless tobacco: Never  Vaping Use   Vaping Use: Never used  Substance and Sexual Activity   Alcohol use: Not Currently  Comment: occasionally   Drug use: Yes    Frequency: 3.0 times per week    Types: Marijuana   Sexual activity: Not on file  Other Topics Concern   Not on file  Social History Narrative   Not on file   Social Determinants of Health   Financial Resource Strain: Not on file  Food Insecurity: Not on file  Transportation Needs: Not on file  Physical Activity: Not on file  Stress: Not on file  Social Connections: Not on file  Intimate Partner Violence: Not on file      Cassetta's family history is not on file.      Objective:    Vitals:   03/24/21  1129  BP: 102/80  Pulse: 88  SpO2: 97%    Physical Exam;Well-developed well-nourished young masculine appearing female in no acute distress.  Height, Weight, 135 BMI 24.8 ,accompanied by friend  HEENT; nontraumatic normocephalic, EOMI, PE R LA, sclera anicteric. Oropharynx; not examined today Neck; supple, no JVD Cardiovascular; regular rate and rhythm with S1-S2, no murmur rub or gallop Pulmonary; Clear bilaterally Abdomen; soft, no focal tenderness nondistended, no palpable mass or hepatosplenomegaly, bowel sounds are active Rectal; not done today Skin; benign exam, no jaundice rash or appreciable lesions Extremities; no clubbing cyanosis or edema skin warm and dry Neuro/Psych; alert and oriented x4, grossly nonfocal mood and affect appropriate        Assessment & Plan:   #55 30 year old none binary female with recent intractable nausea and vomiting now resolved since stopping trazodone #2 weight loss 50 pounds over the past 6 months by patient report-suspect this is secondary to underlying eating disorder/restrictive eating behavior, limiting intake and food aversion self-reported  #3 Constipation-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  Patient encouraged to take 1 Zofran each morning, in hopes of preventing nausea and/or vomiting prior to onset.  Patient encouraged to seek counseling, advised calling [facility]   Juletta Berhe Genia Harold PA-C 03/24/2021   Cc: Daleen Bo, MD

## 2021-03-25 LAB — PREALBUMIN: Prealbumin: 23 mg/dL (ref 17–34)

## 2021-03-25 NOTE — Progress Notes (Signed)
Reviewed and agree with management plans. ? ?Khani Paino L. Rayette Mogg, MD, MPH  ?
